# Patient Record
Sex: Male | Born: 1960 | Race: White | Hispanic: No | Smoking: Never smoker
Health system: Southern US, Community
[De-identification: ages and names within clinical notes are randomized; demographics above are authoritative.]

## PROBLEM LIST (undated history)

## (undated) DIAGNOSIS — I499 Cardiac arrhythmia, unspecified: Secondary | ICD-10-CM

## (undated) DIAGNOSIS — R29898 Other symptoms and signs involving the musculoskeletal system: Secondary | ICD-10-CM

## (undated) DIAGNOSIS — K219 Gastro-esophageal reflux disease without esophagitis: Secondary | ICD-10-CM

## (undated) DIAGNOSIS — I10 Essential (primary) hypertension: Secondary | ICD-10-CM

## (undated) DIAGNOSIS — I639 Cerebral infarction, unspecified: Secondary | ICD-10-CM

## (undated) DIAGNOSIS — I509 Heart failure, unspecified: Secondary | ICD-10-CM

## (undated) DIAGNOSIS — I2510 Atherosclerotic heart disease of native coronary artery without angina pectoris: Secondary | ICD-10-CM

## (undated) HISTORY — PX: PACEMAKER INSERTION: SHX728

## (undated) HISTORY — PX: CARDIAC DEFIBRILLATOR PLACEMENT: SHX171

---

## 1998-04-30 ENCOUNTER — Inpatient Hospital Stay (HOSPITAL_COMMUNITY): Admission: EM | Admit: 1998-04-30 | Discharge: 1998-05-11 | Payer: Self-pay | Admitting: Emergency Medicine

## 1998-05-11 ENCOUNTER — Inpatient Hospital Stay (HOSPITAL_COMMUNITY)
Admission: RE | Admit: 1998-05-11 | Discharge: 1998-05-27 | Payer: Self-pay | Admitting: Physical Medicine & Rehabilitation

## 1998-05-27 ENCOUNTER — Inpatient Hospital Stay (HOSPITAL_COMMUNITY): Admission: AD | Admit: 1998-05-27 | Discharge: 1998-06-05 | Payer: Self-pay | Admitting: Geriatric Medicine

## 1998-06-05 ENCOUNTER — Inpatient Hospital Stay (HOSPITAL_COMMUNITY)
Admission: RE | Admit: 1998-06-05 | Discharge: 1998-06-22 | Payer: Self-pay | Admitting: Physical Medicine and Rehabilitation

## 1998-07-09 ENCOUNTER — Encounter
Admission: RE | Admit: 1998-07-09 | Discharge: 1998-10-07 | Payer: Self-pay | Admitting: Physical Medicine and Rehabilitation

## 1998-11-05 ENCOUNTER — Encounter
Admission: RE | Admit: 1998-11-05 | Discharge: 1999-01-11 | Payer: Self-pay | Admitting: Physical Medicine and Rehabilitation

## 2000-08-25 ENCOUNTER — Encounter
Admission: RE | Admit: 2000-08-25 | Discharge: 2000-09-16 | Payer: Self-pay | Admitting: Physical Medicine and Rehabilitation

## 2000-12-25 ENCOUNTER — Encounter
Admission: RE | Admit: 2000-12-25 | Discharge: 2001-03-25 | Payer: Self-pay | Admitting: Physical Medicine and Rehabilitation

## 2002-05-23 ENCOUNTER — Encounter: Payer: Self-pay | Admitting: Emergency Medicine

## 2002-05-23 ENCOUNTER — Emergency Department (HOSPITAL_COMMUNITY): Admission: EM | Admit: 2002-05-23 | Discharge: 2002-05-23 | Payer: Self-pay | Admitting: Emergency Medicine

## 2004-11-06 ENCOUNTER — Ambulatory Visit (HOSPITAL_COMMUNITY): Admission: RE | Admit: 2004-11-06 | Discharge: 2004-11-06 | Payer: Self-pay

## 2004-11-06 ENCOUNTER — Ambulatory Visit (HOSPITAL_BASED_OUTPATIENT_CLINIC_OR_DEPARTMENT_OTHER): Admission: RE | Admit: 2004-11-06 | Discharge: 2004-11-06 | Payer: Self-pay

## 2007-11-03 ENCOUNTER — Ambulatory Visit (HOSPITAL_COMMUNITY): Admission: RE | Admit: 2007-11-03 | Discharge: 2007-11-03 | Payer: Self-pay | Admitting: Internal Medicine

## 2010-07-14 ENCOUNTER — Emergency Department (HOSPITAL_COMMUNITY): Admission: EM | Admit: 2010-07-14 | Discharge: 2010-07-14 | Payer: Self-pay | Admitting: Emergency Medicine

## 2010-12-29 ENCOUNTER — Encounter: Payer: Self-pay | Admitting: Internal Medicine

## 2011-02-21 LAB — DIFFERENTIAL
Basophils Absolute: 0 10*3/uL (ref 0.0–0.1)
Basophils Relative: 1 % (ref 0–1)
Eosinophils Absolute: 0.1 10*3/uL (ref 0.0–0.7)
Eosinophils Relative: 2 % (ref 0–5)
Lymphs Abs: 1.2 10*3/uL (ref 0.7–4.0)
Monocytes Relative: 10 % (ref 3–12)
Neutro Abs: 3.8 10*3/uL (ref 1.7–7.7)
Neutrophils Relative %: 67 % (ref 43–77)

## 2011-02-21 LAB — CBC
MCH: 33.2 pg (ref 26.0–34.0)
MCHC: 33.7 g/dL (ref 30.0–36.0)
RBC: 4.07 MIL/uL — ABNORMAL LOW (ref 4.22–5.81)
RDW: 12.7 % (ref 11.5–15.5)

## 2011-02-21 LAB — BASIC METABOLIC PANEL
BUN: 23 mg/dL (ref 6–23)
CO2: 28 mEq/L (ref 19–32)
Calcium: 9.1 mg/dL (ref 8.4–10.5)
Creatinine, Ser: 1.03 mg/dL (ref 0.4–1.5)
GFR calc non Af Amer: >60 mL/min (ref >60)
Glucose, Bld: 78 mg/dL (ref 70–99)

## 2011-02-21 LAB — PROTIME-INR: INR: 2.52 — ABNORMAL HIGH (ref 0.00–1.49)

## 2011-02-21 LAB — POCT CARDIAC MARKERS: Troponin i, poc: <0.05 ng/mL (ref 0.00–0.09)

## 2011-04-25 NOTE — Op Note (Signed)
NAME:  Jacob Nunez, Jacob Nunez NO.:  [id number]   MEDICAL RECORD NO.:  [id number]          PATIENT TYPE:  AMB   LOCATION:  [facility]                         FACILITY:  [facility]   PHYSICIAN:  Lorre Munroe., M.D.DATE OF BIRTH:  08-Aug-1961   DATE OF PROCEDURE:  11/06/2004  DATE OF DISCHARGE:                                 OPERATIVE REPORT   PREOPERATIVE DIAGNOSES:  Inflamed sebaceous cyst right side of the neck.   POSTOPERATIVE DIAGNOSES:  Inflamed sebaceous cyst right side of the neck.   OPERATION:  Excision of inflamed sebaceous cyst.   SURGEON:  Lebron Conners, M.D.   ANESTHESIA:  Local with sedation.   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  He is on  Coumadin.  The Coumadin was stopped and the patient has been bridged with  Lovenox in preparation for surgery.  He is brought to the operating room for  excision of the recurrently inflamed sebaceous cyst which has been bothering  him.   DESCRIPTION OF PROCEDURE:  After the patient was monitored and sedated and  had routine preparation and draping of the right posterior neck, I liberally  infused local anesthetic around the mass and then the skin over it.  I made  about a 3 cm incision over the mass and dissecting through the exuberant  scar tissue which was present removed the mass in two segments taking a fair  amount of scar tissue and subcutaneous tissue all around it to be sure I got  it all.  I then got hemostasis using bipolar cautery.  I then irrigated the  cavity and removed the irrigant and packed it with a small piece of Surgicel  and closed the skin with intracuticular 4-0 Vicryl suture reinforced by  Steri-Strips.  He tolerated the operation well.     Will   WB/MEDQ  D:  11/06/2004  T:  11/06/2004  Job:  [id number]   cc:   Georgann Housekeeper, MD  [address]  [place]  [place] [address]  Fax: [phone]

## 2011-07-27 ENCOUNTER — Emergency Department (HOSPITAL_COMMUNITY)
Admission: EM | Admit: 2011-07-27 | Discharge: 2011-07-28 | Disposition: A | Discharge status: Home / Self Care | Payer: Medicare Other | Attending: Emergency Medicine | Admitting: Emergency Medicine

## 2011-07-27 ENCOUNTER — Emergency Department (HOSPITAL_COMMUNITY): Payer: Medicare Other

## 2011-07-27 DIAGNOSIS — R002 Palpitations: Secondary | ICD-10-CM | POA: Insufficient documentation

## 2011-07-27 DIAGNOSIS — I498 Other specified cardiac arrhythmias: Secondary | ICD-10-CM | POA: Insufficient documentation

## 2011-07-27 DIAGNOSIS — J189 Pneumonia, unspecified organism: Secondary | ICD-10-CM | POA: Insufficient documentation

## 2011-07-27 DIAGNOSIS — Z79899 Other long term (current) drug therapy: Secondary | ICD-10-CM | POA: Insufficient documentation

## 2011-07-27 DIAGNOSIS — Z950 Presence of cardiac pacemaker: Secondary | ICD-10-CM | POA: Insufficient documentation

## 2011-07-27 DIAGNOSIS — Z7901 Long term (current) use of anticoagulants: Secondary | ICD-10-CM | POA: Insufficient documentation

## 2011-07-27 DIAGNOSIS — Z8673 Personal history of transient ischemic attack (TIA), and cerebral infarction without residual deficits: Secondary | ICD-10-CM | POA: Insufficient documentation

## 2011-07-27 LAB — BASIC METABOLIC PANEL
BUN: 25 mg/dL — ABNORMAL HIGH (ref 6–23)
CO2: 23 mEq/L (ref 19–32)
Glucose, Bld: 112 mg/dL — ABNORMAL HIGH (ref 70–99)
Potassium: 4.1 mEq/L (ref 3.5–5.1)
Sodium: 138 mEq/L (ref 135–145)

## 2011-07-27 LAB — DIFFERENTIAL
Eosinophils Absolute: 0.1 10*3/uL (ref 0.0–0.7)
Eosinophils Relative: 2 % (ref 0–5)
Lymphocytes Relative: 30 % (ref 12–46)
Lymphs Abs: 1.7 10*3/uL (ref 0.7–4.0)
Monocytes Relative: 15 % — ABNORMAL HIGH (ref 3–12)
Neutrophils Relative %: 53 % (ref 43–77)

## 2011-07-27 LAB — CBC
HCT: 43.2 % (ref 39.0–52.0)
MCH: 33.3 pg (ref 26.0–34.0)
MCV: 95.4 fL (ref 78.0–100.0)
Platelets: 144 10*3/uL — ABNORMAL LOW (ref 150–400)
RBC: 4.53 MIL/uL (ref 4.22–5.81)
WBC: 5.8 10*3/uL (ref 4.0–10.5)

## 2011-07-27 LAB — TROPONIN I: Troponin I: <0.3 ng/mL (ref <0.30)

## 2011-07-27 LAB — CK TOTAL AND CKMB (NOT AT ARMC)
CK, MB: 2.8 ng/mL (ref 0.3–4.0)
Relative Index: INVALID (ref 0.0–2.5)

## 2011-10-04 ENCOUNTER — Emergency Department (HOSPITAL_COMMUNITY)
Admission: EM | Admit: 2011-10-04 | Discharge: 2011-10-04 | Disposition: A | Discharge status: Home / Self Care | Payer: Medicare Other | Attending: Emergency Medicine | Admitting: Emergency Medicine

## 2011-10-04 ENCOUNTER — Emergency Department (HOSPITAL_COMMUNITY): Payer: Medicare Other

## 2011-10-04 DIAGNOSIS — I498 Other specified cardiac arrhythmias: Secondary | ICD-10-CM | POA: Insufficient documentation

## 2011-10-04 DIAGNOSIS — I517 Cardiomegaly: Secondary | ICD-10-CM | POA: Insufficient documentation

## 2011-10-04 DIAGNOSIS — Z95810 Presence of automatic (implantable) cardiac defibrillator: Secondary | ICD-10-CM | POA: Insufficient documentation

## 2011-10-04 DIAGNOSIS — I4510 Unspecified right bundle-branch block: Secondary | ICD-10-CM | POA: Insufficient documentation

## 2011-10-04 DIAGNOSIS — Q249 Congenital malformation of heart, unspecified: Secondary | ICD-10-CM | POA: Insufficient documentation

## 2011-10-04 LAB — BASIC METABOLIC PANEL
Chloride: 105 mEq/L (ref 96–112)
Creatinine, Ser: 0.9 mg/dL (ref 0.50–1.35)
GFR calc Af Amer: >90 mL/min (ref >90)
Sodium: 137 mEq/L (ref 135–145)

## 2011-10-04 LAB — DIFFERENTIAL
Basophils Absolute: 0 10*3/uL (ref 0.0–0.1)
Basophils Relative: 0 % (ref 0–1)
Lymphocytes Relative: 22 % (ref 12–46)
Monocytes Absolute: 0.7 10*3/uL (ref 0.1–1.0)
Neutro Abs: 3.1 10*3/uL (ref 1.7–7.7)
Neutrophils Relative %: 64 % (ref 43–77)

## 2011-10-04 LAB — CBC
HCT: 44.4 % (ref 39.0–52.0)
Hemoglobin: 15.2 g/dL (ref 13.0–17.0)
MCHC: 34.2 g/dL (ref 30.0–36.0)
RBC: 4.63 MIL/uL (ref 4.22–5.81)
WBC: 4.8 10*3/uL (ref 4.0–10.5)

## 2011-10-04 LAB — POCT I-STAT TROPONIN I: Troponin i, poc: 0.01 ng/mL (ref 0.00–0.08)

## 2011-11-16 ENCOUNTER — Encounter: Payer: Self-pay | Admitting: *Deleted

## 2011-11-16 ENCOUNTER — Emergency Department (HOSPITAL_COMMUNITY): Payer: Medicare Other

## 2011-11-16 ENCOUNTER — Other Ambulatory Visit: Payer: Self-pay

## 2011-11-16 ENCOUNTER — Emergency Department (HOSPITAL_COMMUNITY)
Admission: EM | Admit: 2011-11-16 | Discharge: 2011-11-17 | Payer: Medicare Other | Attending: Emergency Medicine | Admitting: Emergency Medicine

## 2011-11-16 DIAGNOSIS — R0600 Dyspnea, unspecified: Secondary | ICD-10-CM

## 2011-11-16 DIAGNOSIS — R0902 Hypoxemia: Secondary | ICD-10-CM

## 2011-11-16 DIAGNOSIS — R0609 Other forms of dyspnea: Secondary | ICD-10-CM | POA: Insufficient documentation

## 2011-11-16 DIAGNOSIS — K219 Gastro-esophageal reflux disease without esophagitis: Secondary | ICD-10-CM | POA: Insufficient documentation

## 2011-11-16 DIAGNOSIS — J189 Pneumonia, unspecified organism: Secondary | ICD-10-CM | POA: Insufficient documentation

## 2011-11-16 DIAGNOSIS — Z8673 Personal history of transient ischemic attack (TIA), and cerebral infarction without residual deficits: Secondary | ICD-10-CM | POA: Insufficient documentation

## 2011-11-16 DIAGNOSIS — R059 Cough, unspecified: Secondary | ICD-10-CM | POA: Insufficient documentation

## 2011-11-16 DIAGNOSIS — R0989 Other specified symptoms and signs involving the circulatory and respiratory systems: Secondary | ICD-10-CM | POA: Insufficient documentation

## 2011-11-16 DIAGNOSIS — I499 Cardiac arrhythmia, unspecified: Secondary | ICD-10-CM | POA: Insufficient documentation

## 2011-11-16 DIAGNOSIS — Q203 Discordant ventriculoarterial connection: Secondary | ICD-10-CM | POA: Insufficient documentation

## 2011-11-16 DIAGNOSIS — R05 Cough: Secondary | ICD-10-CM | POA: Insufficient documentation

## 2011-11-16 DIAGNOSIS — R29898 Other symptoms and signs involving the musculoskeletal system: Secondary | ICD-10-CM | POA: Insufficient documentation

## 2011-11-16 DIAGNOSIS — Z950 Presence of cardiac pacemaker: Secondary | ICD-10-CM | POA: Insufficient documentation

## 2011-11-16 DIAGNOSIS — R0602 Shortness of breath: Secondary | ICD-10-CM | POA: Insufficient documentation

## 2011-11-16 DIAGNOSIS — R509 Fever, unspecified: Secondary | ICD-10-CM | POA: Insufficient documentation

## 2011-11-16 HISTORY — DX: Cardiac arrhythmia, unspecified: I49.9

## 2011-11-16 HISTORY — DX: Other symptoms and signs involving the musculoskeletal system: R29.898

## 2011-11-16 HISTORY — DX: Essential (primary) hypertension: I10

## 2011-11-16 HISTORY — DX: Atherosclerotic heart disease of native coronary artery without angina pectoris: I25.10

## 2011-11-16 HISTORY — DX: Gastro-esophageal reflux disease without esophagitis: K21.9

## 2011-11-16 HISTORY — DX: Heart failure, unspecified: I50.9

## 2011-11-16 HISTORY — DX: Cerebral infarction, unspecified: I63.9

## 2011-11-16 LAB — CBC
HCT: 39.9 % (ref 39.0–52.0)
Platelets: 117 10*3/uL — ABNORMAL LOW (ref 150–400)
RBC: 4.06 MIL/uL — ABNORMAL LOW (ref 4.22–5.81)
RDW: 14.5 % (ref 11.5–15.5)
WBC: 10 10*3/uL (ref 4.0–10.5)

## 2011-11-16 LAB — URINALYSIS, ROUTINE W REFLEX MICROSCOPIC
Bilirubin Urine: NEGATIVE
Leukocytes, UA: NEGATIVE
Nitrite: NEGATIVE
Specific Gravity, Urine: 1.013 (ref 1.005–1.030)
Urobilinogen, UA: 0.2 mg/dL (ref 0.0–1.0)

## 2011-11-16 LAB — COMPREHENSIVE METABOLIC PANEL
ALT: 10 U/L (ref 0–53)
AST: 19 U/L (ref 0–37)
Alkaline Phosphatase: 70 U/L (ref 39–117)
Calcium: 9.1 mg/dL (ref 8.4–10.5)
Potassium: 4.5 mEq/L (ref 3.5–5.1)
Sodium: 140 mEq/L (ref 135–145)
Total Protein: 7.6 g/dL (ref 6.0–8.3)

## 2011-11-16 LAB — DIFFERENTIAL
Basophils Absolute: 0 10*3/uL (ref 0.0–0.1)
Eosinophils Absolute: 0.1 10*3/uL (ref 0.0–0.7)
Eosinophils Relative: 1 % (ref 0–5)
Lymphocytes Relative: 10 % — ABNORMAL LOW (ref 12–46)
Neutrophils Relative %: 81 % — ABNORMAL HIGH (ref 43–77)

## 2011-11-16 LAB — CK TOTAL AND CKMB (NOT AT ARMC)
CK, MB: 2.3 ng/mL (ref 0.3–4.0)
Relative Index: INVALID (ref 0.0–2.5)

## 2011-11-16 LAB — TROPONIN I: Troponin I: <0.3 ng/mL (ref <0.30)

## 2011-11-16 MED ORDER — ALBUTEROL SULFATE (5 MG/ML) 0.5% IN NEBU
INHALATION_SOLUTION | RESPIRATORY_TRACT | Status: AC
  Filled 2011-11-16: qty 1

## 2011-11-16 MED ORDER — PIPERACILLIN-TAZOBACTAM 3.375 G IVPB
3.3750 g | Freq: Once | INTRAVENOUS | Status: AC
  Administered 2011-11-16: 3.375 g via INTRAVENOUS
  Filled 2011-11-16: qty 50

## 2011-11-16 MED ORDER — ACETAMINOPHEN 325 MG PO TABS
ORAL_TABLET | ORAL | Status: AC
  Filled 2011-11-16: qty 2

## 2011-11-16 MED ORDER — ACETAMINOPHEN 325 MG PO TABS
ORAL_TABLET | ORAL | Status: AC
  Filled 2011-11-16: qty 1

## 2011-11-16 MED ORDER — ACETAMINOPHEN 500 MG PO TABS
1000.0000 mg | ORAL_TABLET | Freq: Once | ORAL | Status: AC
  Administered 2011-11-16: 975 mg via ORAL
  Filled 2011-11-16: qty 2

## 2011-11-16 MED ORDER — SODIUM CHLORIDE 0.9 % IV SOLN
Freq: Once | INTRAVENOUS | Status: AC
  Administered 2011-11-16: 16:00:00 via INTRAVENOUS

## 2011-11-16 NOTE — ED Notes (Signed)
Awaiting bed assignment at [facility]. Ate half of diet tray

## 2011-11-16 NOTE — ED Notes (Signed)
Gave old and new ECG to Dr. Judd Lien after I performed.12:27 pm JG

## 2011-11-16 NOTE — ED Notes (Signed)
S/P pacemaker surgery (wires changed) last Thursday. C/o non prod cough since yesterday. Today vomit x1 with coughing spell, chills, worsening SOB. Denies any pain. Denies SOB presently, states neb given by EMS helped a lot. Continues to be tachypneic.

## 2011-11-16 NOTE — ED Provider Notes (Signed)
History     CSN: [id number] Arrival date & time: 11/16/2011 12:06 PM   First MD Initiated Contact with Patient 11/16/11 1210      Chief Complaint  Patient presents with  . Shortness of Breath  . 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  Most recently he had a procedure to remove/replace pacemaker/defib leads at [facility].  Started today with cough, chest congestion and feeling short of breath.  Per mother, patient gets pneumonia easily.  Patient is a 50 y.o. male presenting with shortness of breath and cough. The history is provided by the patient and a relative.  Shortness of Breath  The current episode started today. The problem occurs occasionally. The problem has been gradually worsening. The problem is moderate. The symptoms are relieved by nothing. Associated symptoms include cough and shortness of breath. Pertinent negatives include no chest pain, no chest pressure and no fever.  Cough Associated symptoms include shortness of breath. Pertinent negatives include no chest pain.    Past Medical History  Diagnosis Date  . Stroke   . Arrhythmia     Past Surgical History  Procedure Date  . Pacemaker insertion     No family history on file.  History  Substance Use Topics  . Smoking status: Not on file  . Smokeless tobacco: Not on file  . Alcohol Use:       Review of Systems  Constitutional: Negative for fever.  Respiratory: Positive for cough and shortness of breath.   Cardiovascular: Negative for chest pain.  All other systems reviewed and are negative.    Allergies  Zithromax  Home Medications   Current Outpatient Rx  Name Route Sig Dispense Refill  . ALBUTEROL SULFATE (2.5 MG/3ML) 0.083% IN NEBU Nebulization Take 5 mg by nebulization.        BP 119/64  Pulse 79  Temp(Src)  99.5 F (37.5 C) (Oral)  Resp 13  SpO2 93%  Physical Exam  Constitutional: He is oriented to person, place, and time. He appears well-developed and well-nourished.  HENT:  Head: Normocephalic and atraumatic.  Mouth/Throat: Oropharynx is clear and moist.  Neck: Normal range of motion. Neck supple.  Cardiovascular: Normal rate and regular rhythm.   Pulmonary/Chest: Effort normal. He has rales.       In bases bilat.  Abdominal: Soft. Bowel sounds are normal. He exhibits no distension. There is no tenderness.  Musculoskeletal: Normal range of motion. He exhibits no edema.  Neurological: He is alert and oriented to person, place, and time.  Skin: Skin is warm and dry.    ED Course  Procedures (including critical care time)  Labs Reviewed - No data to display No results found.   No diagnosis found.    MDM  Patient has a complex medical history and the vast majority of his care has been at [facility].  Today he is hypoxic, coughing and gagging.  He has no wbc count or fever but mother is concerned he develops pneumonia easily.  I spoke with Dr. Sherlene Shams who is on call at [facility] for Pediatric cardiology to discuss the proper management of this patient.   Dr. Sherlene Shams called back and informed me he is in the process of arranging a bed and that we should hear soon.  I updated him on the patient's new fever.  We discussed blood cultures which will be obtained,  and antibiotics will be given as long as this does not hold up the transfer.  Awaiting bed assignment.          Geoffery Lyons, MD 11/16/11 1556

## 2011-11-16 NOTE — ED Notes (Signed)
Informed by [facility] that pt would not be picked up for transport until tomorrow morning d/t [facility] only having 2 trucks in service. [facility] called for transport. Informed they would arrive in 3-4 hours. Family notified.

## 2011-11-16 NOTE — ED Notes (Signed)
C/o SOB, cough x 2 days. Per EMS - POX 84% RA upon arrival, neb with albuterol 5mg , SL LH

## 2011-11-16 NOTE — ED Notes (Signed)
Informed patient and/or family of status. Denies any pain or SOB. Continues to be tachypneic, RR 28/min. NAD.

## 2011-11-16 NOTE — ED Notes (Signed)
Continues to rest quietly in bed. Resp even and unlabored. Denies feeling sob. On 3L o2 per n/c. Denies pain. Family at bedside. Awaiting transfer to [facility].

## 2011-11-16 NOTE — ED Notes (Signed)
[place] EDP at bedside. 

## 2011-11-16 NOTE — ED Notes (Signed)
No change in pt condition. Continues to rest quietly in bed. No sob or pain noted. Awaiting transfer

## 2011-11-16 NOTE — ED Notes (Signed)
Informed patient and/or family of status. Pt to be transferred to [facility]. Awaiting call from [facility] with an inpatient bed. No voiced complaints presently. Diet tray ordered

## 2011-11-17 NOTE — ED Notes (Signed)
Pt transferred to [facility] by [facility] transport. Mother is power of attorney and signed EMTALA transfer document.

## 2011-11-18 LAB — URINE CULTURE

## 2011-11-22 LAB — CULTURE, BLOOD (ROUTINE X 2): Culture: NO GROWTH

## 2011-12-12 DIAGNOSIS — Q203 Discordant ventriculoarterial connection: Secondary | ICD-10-CM | POA: Diagnosis not present

## 2011-12-31 DIAGNOSIS — Z7901 Long term (current) use of anticoagulants: Secondary | ICD-10-CM | POA: Diagnosis not present

## 2012-01-09 DIAGNOSIS — Z09 Encounter for follow-up examination after completed treatment for conditions other than malignant neoplasm: Secondary | ICD-10-CM | POA: Diagnosis not present

## 2012-01-09 DIAGNOSIS — J90 Pleural effusion, not elsewhere classified: Secondary | ICD-10-CM | POA: Diagnosis not present

## 2012-01-09 DIAGNOSIS — R918 Other nonspecific abnormal finding of lung field: Secondary | ICD-10-CM | POA: Diagnosis not present

## 2012-01-09 DIAGNOSIS — I5022 Chronic systolic (congestive) heart failure: Secondary | ICD-10-CM | POA: Diagnosis not present

## 2012-01-28 DIAGNOSIS — Z7901 Long term (current) use of anticoagulants: Secondary | ICD-10-CM | POA: Diagnosis not present

## 2012-02-13 DIAGNOSIS — I509 Heart failure, unspecified: Secondary | ICD-10-CM | POA: Diagnosis not present

## 2012-02-13 DIAGNOSIS — Z0000 Encounter for general adult medical examination without abnormal findings: Secondary | ICD-10-CM | POA: Diagnosis not present

## 2012-02-13 DIAGNOSIS — K219 Gastro-esophageal reflux disease without esophagitis: Secondary | ICD-10-CM | POA: Diagnosis not present

## 2012-02-13 DIAGNOSIS — I6990 Unspecified sequelae of unspecified cerebrovascular disease: Secondary | ICD-10-CM | POA: Diagnosis not present

## 2012-02-13 DIAGNOSIS — I10 Essential (primary) hypertension: Secondary | ICD-10-CM | POA: Diagnosis not present

## 2012-02-23 DIAGNOSIS — J069 Acute upper respiratory infection, unspecified: Secondary | ICD-10-CM | POA: Diagnosis not present

## 2012-02-25 DIAGNOSIS — Z7901 Long term (current) use of anticoagulants: Secondary | ICD-10-CM | POA: Diagnosis not present

## 2012-03-09 DIAGNOSIS — Z8774 Personal history of (corrected) congenital malformations of heart and circulatory system: Secondary | ICD-10-CM | POA: Diagnosis not present

## 2012-03-09 DIAGNOSIS — Z9889 Other specified postprocedural states: Secondary | ICD-10-CM | POA: Diagnosis not present

## 2012-03-09 DIAGNOSIS — Q203 Discordant ventriculoarterial connection: Secondary | ICD-10-CM | POA: Diagnosis not present

## 2012-03-09 DIAGNOSIS — Z7901 Long term (current) use of anticoagulants: Secondary | ICD-10-CM | POA: Diagnosis not present

## 2012-03-09 DIAGNOSIS — I495 Sick sinus syndrome: Secondary | ICD-10-CM | POA: Diagnosis not present

## 2012-03-09 DIAGNOSIS — I2789 Other specified pulmonary heart diseases: Secondary | ICD-10-CM | POA: Diagnosis not present

## 2012-03-09 DIAGNOSIS — Z79899 Other long term (current) drug therapy: Secondary | ICD-10-CM | POA: Diagnosis not present

## 2012-03-15 DIAGNOSIS — Z7901 Long term (current) use of anticoagulants: Secondary | ICD-10-CM | POA: Diagnosis not present

## 2012-03-22 DIAGNOSIS — Z7901 Long term (current) use of anticoagulants: Secondary | ICD-10-CM | POA: Diagnosis not present

## 2012-04-01 DIAGNOSIS — I519 Heart disease, unspecified: Secondary | ICD-10-CM | POA: Diagnosis not present

## 2012-04-21 DIAGNOSIS — Z7901 Long term (current) use of anticoagulants: Secondary | ICD-10-CM | POA: Diagnosis not present

## 2012-05-19 DIAGNOSIS — Z7901 Long term (current) use of anticoagulants: Secondary | ICD-10-CM | POA: Diagnosis not present

## 2012-05-26 DIAGNOSIS — R05 Cough: Secondary | ICD-10-CM | POA: Diagnosis not present

## 2012-06-16 DIAGNOSIS — Z7901 Long term (current) use of anticoagulants: Secondary | ICD-10-CM | POA: Diagnosis not present

## 2012-06-16 DIAGNOSIS — R05 Cough: Secondary | ICD-10-CM | POA: Diagnosis not present

## 2012-06-18 DIAGNOSIS — Q203 Discordant ventriculoarterial connection: Secondary | ICD-10-CM | POA: Diagnosis not present

## 2012-07-14 DIAGNOSIS — Z7901 Long term (current) use of anticoagulants: Secondary | ICD-10-CM | POA: Diagnosis not present

## 2012-07-30 DIAGNOSIS — Q203 Discordant ventriculoarterial connection: Secondary | ICD-10-CM | POA: Diagnosis not present

## 2012-07-30 DIAGNOSIS — I2789 Other specified pulmonary heart diseases: Secondary | ICD-10-CM | POA: Diagnosis not present

## 2012-07-30 DIAGNOSIS — I6350 Cerebral infarction due to unspecified occlusion or stenosis of unspecified cerebral artery: Secondary | ICD-10-CM | POA: Diagnosis not present

## 2012-07-30 DIAGNOSIS — Z95810 Presence of automatic (implantable) cardiac defibrillator: Secondary | ICD-10-CM | POA: Diagnosis not present

## 2012-08-11 DIAGNOSIS — Z7901 Long term (current) use of anticoagulants: Secondary | ICD-10-CM | POA: Diagnosis not present

## 2012-08-17 DIAGNOSIS — K219 Gastro-esophageal reflux disease without esophagitis: Secondary | ICD-10-CM | POA: Diagnosis not present

## 2012-08-17 DIAGNOSIS — I6990 Unspecified sequelae of unspecified cerebrovascular disease: Secondary | ICD-10-CM | POA: Diagnosis not present

## 2012-08-17 DIAGNOSIS — Z0000 Encounter for general adult medical examination without abnormal findings: Secondary | ICD-10-CM | POA: Diagnosis not present

## 2012-08-17 DIAGNOSIS — Z125 Encounter for screening for malignant neoplasm of prostate: Secondary | ICD-10-CM | POA: Diagnosis not present

## 2012-08-17 DIAGNOSIS — I509 Heart failure, unspecified: Secondary | ICD-10-CM | POA: Diagnosis not present

## 2012-08-17 DIAGNOSIS — I10 Essential (primary) hypertension: Secondary | ICD-10-CM | POA: Diagnosis not present

## 2012-08-17 DIAGNOSIS — Z1331 Encounter for screening for depression: Secondary | ICD-10-CM | POA: Diagnosis not present

## 2012-08-17 DIAGNOSIS — I4891 Unspecified atrial fibrillation: Secondary | ICD-10-CM | POA: Diagnosis not present

## 2012-08-17 DIAGNOSIS — Z23 Encounter for immunization: Secondary | ICD-10-CM | POA: Diagnosis not present

## 2012-08-19 DIAGNOSIS — Z1211 Encounter for screening for malignant neoplasm of colon: Secondary | ICD-10-CM | POA: Diagnosis not present

## 2012-09-01 DIAGNOSIS — Z79899 Other long term (current) drug therapy: Secondary | ICD-10-CM | POA: Diagnosis not present

## 2012-09-07 DIAGNOSIS — Z7901 Long term (current) use of anticoagulants: Secondary | ICD-10-CM | POA: Diagnosis not present

## 2012-09-07 DIAGNOSIS — I509 Heart failure, unspecified: Secondary | ICD-10-CM | POA: Diagnosis not present

## 2012-09-14 DIAGNOSIS — Z23 Encounter for immunization: Secondary | ICD-10-CM | POA: Diagnosis not present

## 2012-09-22 DIAGNOSIS — I509 Heart failure, unspecified: Secondary | ICD-10-CM | POA: Diagnosis not present

## 2012-09-22 DIAGNOSIS — Z7901 Long term (current) use of anticoagulants: Secondary | ICD-10-CM | POA: Diagnosis not present

## 2012-10-06 DIAGNOSIS — Z7901 Long term (current) use of anticoagulants: Secondary | ICD-10-CM | POA: Diagnosis not present

## 2012-10-20 DIAGNOSIS — Z7901 Long term (current) use of anticoagulants: Secondary | ICD-10-CM | POA: Diagnosis not present

## 2012-11-17 DIAGNOSIS — Z7901 Long term (current) use of anticoagulants: Secondary | ICD-10-CM | POA: Diagnosis not present

## 2012-11-29 DIAGNOSIS — R350 Frequency of micturition: Secondary | ICD-10-CM | POA: Diagnosis not present

## 2012-12-15 DIAGNOSIS — R350 Frequency of micturition: Secondary | ICD-10-CM | POA: Diagnosis not present

## 2012-12-15 DIAGNOSIS — Z7901 Long term (current) use of anticoagulants: Secondary | ICD-10-CM | POA: Diagnosis not present

## 2012-12-17 DIAGNOSIS — I509 Heart failure, unspecified: Secondary | ICD-10-CM | POA: Diagnosis not present

## 2012-12-17 DIAGNOSIS — Q203 Discordant ventriculoarterial connection: Secondary | ICD-10-CM | POA: Diagnosis not present

## 2012-12-17 DIAGNOSIS — R0602 Shortness of breath: Secondary | ICD-10-CM | POA: Diagnosis not present

## 2013-01-12 DIAGNOSIS — Z7901 Long term (current) use of anticoagulants: Secondary | ICD-10-CM | POA: Diagnosis not present

## 2013-02-04 DIAGNOSIS — I2789 Other specified pulmonary heart diseases: Secondary | ICD-10-CM | POA: Diagnosis not present

## 2013-02-04 DIAGNOSIS — I079 Rheumatic tricuspid valve disease, unspecified: Secondary | ICD-10-CM | POA: Diagnosis not present

## 2013-02-04 DIAGNOSIS — I288 Other diseases of pulmonary vessels: Secondary | ICD-10-CM | POA: Diagnosis not present

## 2013-02-04 DIAGNOSIS — I517 Cardiomegaly: Secondary | ICD-10-CM | POA: Diagnosis not present

## 2013-02-04 DIAGNOSIS — I495 Sick sinus syndrome: Secondary | ICD-10-CM | POA: Diagnosis not present

## 2013-02-04 DIAGNOSIS — I080 Rheumatic disorders of both mitral and aortic valves: Secondary | ICD-10-CM | POA: Diagnosis not present

## 2013-02-04 DIAGNOSIS — I4892 Unspecified atrial flutter: Secondary | ICD-10-CM | POA: Diagnosis not present

## 2013-02-04 DIAGNOSIS — Q203 Discordant ventriculoarterial connection: Secondary | ICD-10-CM | POA: Diagnosis not present

## 2013-02-04 DIAGNOSIS — R091 Pleurisy: Secondary | ICD-10-CM | POA: Diagnosis not present

## 2013-02-15 DIAGNOSIS — I4891 Unspecified atrial fibrillation: Secondary | ICD-10-CM | POA: Diagnosis not present

## 2013-02-17 DIAGNOSIS — I2789 Other specified pulmonary heart diseases: Secondary | ICD-10-CM | POA: Diagnosis not present

## 2013-02-17 DIAGNOSIS — Z95810 Presence of automatic (implantable) cardiac defibrillator: Secondary | ICD-10-CM | POA: Diagnosis not present

## 2013-02-17 DIAGNOSIS — F79 Unspecified intellectual disabilities: Secondary | ICD-10-CM | POA: Diagnosis not present

## 2013-02-17 DIAGNOSIS — Z09 Encounter for follow-up examination after completed treatment for conditions other than malignant neoplasm: Secondary | ICD-10-CM | POA: Diagnosis not present

## 2013-02-17 DIAGNOSIS — Z9889 Other specified postprocedural states: Secondary | ICD-10-CM | POA: Diagnosis not present

## 2013-02-17 DIAGNOSIS — I69998 Other sequelae following unspecified cerebrovascular disease: Secondary | ICD-10-CM | POA: Diagnosis not present

## 2013-03-23 DIAGNOSIS — Z7901 Long term (current) use of anticoagulants: Secondary | ICD-10-CM | POA: Diagnosis not present

## 2013-04-13 DIAGNOSIS — Z7901 Long term (current) use of anticoagulants: Secondary | ICD-10-CM | POA: Diagnosis not present

## 2013-05-11 DIAGNOSIS — Z7901 Long term (current) use of anticoagulants: Secondary | ICD-10-CM | POA: Diagnosis not present

## 2013-06-08 DIAGNOSIS — Z7901 Long term (current) use of anticoagulants: Secondary | ICD-10-CM | POA: Diagnosis not present

## 2013-06-17 DIAGNOSIS — I2789 Other specified pulmonary heart diseases: Secondary | ICD-10-CM | POA: Diagnosis not present

## 2013-06-17 DIAGNOSIS — Q203 Discordant ventriculoarterial connection: Secondary | ICD-10-CM | POA: Diagnosis not present

## 2013-07-06 DIAGNOSIS — Z7901 Long term (current) use of anticoagulants: Secondary | ICD-10-CM | POA: Diagnosis not present

## 2013-07-27 DIAGNOSIS — Z7901 Long term (current) use of anticoagulants: Secondary | ICD-10-CM | POA: Diagnosis not present

## 2013-08-01 DIAGNOSIS — Z95810 Presence of automatic (implantable) cardiac defibrillator: Secondary | ICD-10-CM | POA: Diagnosis not present

## 2013-08-01 DIAGNOSIS — Z9889 Other specified postprocedural states: Secondary | ICD-10-CM | POA: Diagnosis not present

## 2013-08-01 DIAGNOSIS — Q203 Discordant ventriculoarterial connection: Secondary | ICD-10-CM | POA: Diagnosis not present

## 2013-08-01 DIAGNOSIS — I495 Sick sinus syndrome: Secondary | ICD-10-CM | POA: Diagnosis not present

## 2013-08-01 DIAGNOSIS — Z4502 Encounter for adjustment and management of automatic implantable cardiac defibrillator: Secondary | ICD-10-CM | POA: Diagnosis not present

## 2013-08-01 DIAGNOSIS — I2789 Other specified pulmonary heart diseases: Secondary | ICD-10-CM | POA: Diagnosis not present

## 2013-08-01 DIAGNOSIS — I4892 Unspecified atrial flutter: Secondary | ICD-10-CM | POA: Diagnosis not present

## 2013-08-19 DIAGNOSIS — Z7901 Long term (current) use of anticoagulants: Secondary | ICD-10-CM | POA: Diagnosis not present

## 2013-08-19 DIAGNOSIS — I4891 Unspecified atrial fibrillation: Secondary | ICD-10-CM | POA: Diagnosis not present

## 2013-08-19 DIAGNOSIS — K219 Gastro-esophageal reflux disease without esophagitis: Secondary | ICD-10-CM | POA: Diagnosis not present

## 2013-08-19 DIAGNOSIS — Z0000 Encounter for general adult medical examination without abnormal findings: Secondary | ICD-10-CM | POA: Diagnosis not present

## 2013-08-19 DIAGNOSIS — I6990 Unspecified sequelae of unspecified cerebrovascular disease: Secondary | ICD-10-CM | POA: Diagnosis not present

## 2013-08-19 DIAGNOSIS — I10 Essential (primary) hypertension: Secondary | ICD-10-CM | POA: Diagnosis not present

## 2013-08-19 DIAGNOSIS — I509 Heart failure, unspecified: Secondary | ICD-10-CM | POA: Diagnosis not present

## 2013-09-21 DIAGNOSIS — Z7901 Long term (current) use of anticoagulants: Secondary | ICD-10-CM | POA: Diagnosis not present

## 2013-10-08 DIAGNOSIS — Z23 Encounter for immunization: Secondary | ICD-10-CM | POA: Diagnosis not present

## 2013-10-19 DIAGNOSIS — Z7901 Long term (current) use of anticoagulants: Secondary | ICD-10-CM | POA: Diagnosis not present

## 2013-10-19 DIAGNOSIS — I4891 Unspecified atrial fibrillation: Secondary | ICD-10-CM | POA: Diagnosis not present

## 2013-11-14 DIAGNOSIS — Z4502 Encounter for adjustment and management of automatic implantable cardiac defibrillator: Secondary | ICD-10-CM | POA: Diagnosis not present

## 2013-11-16 DIAGNOSIS — Z7901 Long term (current) use of anticoagulants: Secondary | ICD-10-CM | POA: Diagnosis not present

## 2013-11-16 DIAGNOSIS — I4891 Unspecified atrial fibrillation: Secondary | ICD-10-CM | POA: Diagnosis not present

## 2013-12-14 DIAGNOSIS — Z8679 Personal history of other diseases of the circulatory system: Secondary | ICD-10-CM | POA: Diagnosis not present

## 2013-12-14 DIAGNOSIS — Z7901 Long term (current) use of anticoagulants: Secondary | ICD-10-CM | POA: Diagnosis not present

## 2013-12-16 DIAGNOSIS — Q203 Discordant ventriculoarterial connection: Secondary | ICD-10-CM | POA: Diagnosis not present

## 2013-12-16 DIAGNOSIS — I4892 Unspecified atrial flutter: Secondary | ICD-10-CM | POA: Diagnosis not present

## 2014-01-04 DIAGNOSIS — Z7901 Long term (current) use of anticoagulants: Secondary | ICD-10-CM | POA: Diagnosis not present

## 2014-01-04 DIAGNOSIS — Z79899 Other long term (current) drug therapy: Secondary | ICD-10-CM | POA: Diagnosis not present

## 2014-01-04 DIAGNOSIS — Z8679 Personal history of other diseases of the circulatory system: Secondary | ICD-10-CM | POA: Diagnosis not present

## 2014-02-01 DIAGNOSIS — Z7901 Long term (current) use of anticoagulants: Secondary | ICD-10-CM | POA: Diagnosis not present

## 2014-02-01 DIAGNOSIS — I4891 Unspecified atrial fibrillation: Secondary | ICD-10-CM | POA: Diagnosis not present

## 2014-02-11 DIAGNOSIS — I519 Heart disease, unspecified: Secondary | ICD-10-CM | POA: Diagnosis not present

## 2014-02-17 DIAGNOSIS — K219 Gastro-esophageal reflux disease without esophagitis: Secondary | ICD-10-CM | POA: Diagnosis not present

## 2014-02-17 DIAGNOSIS — N183 Chronic kidney disease, stage 3 unspecified: Secondary | ICD-10-CM | POA: Diagnosis not present

## 2014-02-17 DIAGNOSIS — I509 Heart failure, unspecified: Secondary | ICD-10-CM | POA: Diagnosis not present

## 2014-02-17 DIAGNOSIS — I10 Essential (primary) hypertension: Secondary | ICD-10-CM | POA: Diagnosis not present

## 2014-02-17 DIAGNOSIS — I4891 Unspecified atrial fibrillation: Secondary | ICD-10-CM | POA: Diagnosis not present

## 2014-02-27 DIAGNOSIS — I2789 Other specified pulmonary heart diseases: Secondary | ICD-10-CM | POA: Diagnosis not present

## 2014-02-27 DIAGNOSIS — Z5181 Encounter for therapeutic drug level monitoring: Secondary | ICD-10-CM | POA: Diagnosis not present

## 2014-02-27 DIAGNOSIS — I428 Other cardiomyopathies: Secondary | ICD-10-CM | POA: Diagnosis not present

## 2014-02-27 DIAGNOSIS — Z79899 Other long term (current) drug therapy: Secondary | ICD-10-CM | POA: Diagnosis not present

## 2014-02-27 DIAGNOSIS — I6350 Cerebral infarction due to unspecified occlusion or stenosis of unspecified cerebral artery: Secondary | ICD-10-CM | POA: Diagnosis not present

## 2014-02-27 DIAGNOSIS — Q203 Discordant ventriculoarterial connection: Secondary | ICD-10-CM | POA: Diagnosis not present

## 2014-03-01 DIAGNOSIS — Z7901 Long term (current) use of anticoagulants: Secondary | ICD-10-CM | POA: Diagnosis not present

## 2014-03-01 DIAGNOSIS — I4891 Unspecified atrial fibrillation: Secondary | ICD-10-CM | POA: Diagnosis not present

## 2014-03-04 DIAGNOSIS — I4891 Unspecified atrial fibrillation: Secondary | ICD-10-CM | POA: Diagnosis not present

## 2014-03-04 DIAGNOSIS — Z7901 Long term (current) use of anticoagulants: Secondary | ICD-10-CM | POA: Diagnosis not present

## 2014-03-08 DIAGNOSIS — I4891 Unspecified atrial fibrillation: Secondary | ICD-10-CM | POA: Diagnosis not present

## 2014-03-08 DIAGNOSIS — Z7901 Long term (current) use of anticoagulants: Secondary | ICD-10-CM | POA: Diagnosis not present

## 2014-03-29 DIAGNOSIS — I4891 Unspecified atrial fibrillation: Secondary | ICD-10-CM | POA: Diagnosis not present

## 2014-03-29 DIAGNOSIS — Z7901 Long term (current) use of anticoagulants: Secondary | ICD-10-CM | POA: Diagnosis not present

## 2014-04-26 DIAGNOSIS — I4891 Unspecified atrial fibrillation: Secondary | ICD-10-CM | POA: Diagnosis not present

## 2014-04-26 DIAGNOSIS — Z7901 Long term (current) use of anticoagulants: Secondary | ICD-10-CM | POA: Diagnosis not present

## 2014-05-15 DIAGNOSIS — I519 Heart disease, unspecified: Secondary | ICD-10-CM | POA: Diagnosis not present

## 2014-05-24 DIAGNOSIS — Z7901 Long term (current) use of anticoagulants: Secondary | ICD-10-CM | POA: Diagnosis not present

## 2014-05-24 DIAGNOSIS — I4891 Unspecified atrial fibrillation: Secondary | ICD-10-CM | POA: Diagnosis not present

## 2014-06-21 DIAGNOSIS — Z7901 Long term (current) use of anticoagulants: Secondary | ICD-10-CM | POA: Diagnosis not present

## 2014-06-21 DIAGNOSIS — I4891 Unspecified atrial fibrillation: Secondary | ICD-10-CM | POA: Diagnosis not present

## 2014-06-30 DIAGNOSIS — Q203 Discordant ventriculoarterial connection: Secondary | ICD-10-CM | POA: Diagnosis not present

## 2014-06-30 DIAGNOSIS — I2789 Other specified pulmonary heart diseases: Secondary | ICD-10-CM | POA: Diagnosis not present

## 2014-06-30 DIAGNOSIS — Z9889 Other specified postprocedural states: Secondary | ICD-10-CM | POA: Diagnosis not present

## 2014-07-19 DIAGNOSIS — Z7901 Long term (current) use of anticoagulants: Secondary | ICD-10-CM | POA: Diagnosis not present

## 2014-07-19 DIAGNOSIS — Z5181 Encounter for therapeutic drug level monitoring: Secondary | ICD-10-CM | POA: Diagnosis not present

## 2014-08-15 DIAGNOSIS — Q203 Discordant ventriculoarterial connection: Secondary | ICD-10-CM | POA: Diagnosis not present

## 2014-08-15 DIAGNOSIS — Z95810 Presence of automatic (implantable) cardiac defibrillator: Secondary | ICD-10-CM | POA: Diagnosis not present

## 2014-08-15 DIAGNOSIS — I519 Heart disease, unspecified: Secondary | ICD-10-CM | POA: Diagnosis not present

## 2014-08-15 DIAGNOSIS — I4892 Unspecified atrial flutter: Secondary | ICD-10-CM | POA: Diagnosis not present

## 2014-08-16 DIAGNOSIS — Z7901 Long term (current) use of anticoagulants: Secondary | ICD-10-CM | POA: Diagnosis not present

## 2014-08-16 DIAGNOSIS — Z5181 Encounter for therapeutic drug level monitoring: Secondary | ICD-10-CM | POA: Diagnosis not present

## 2014-08-22 DIAGNOSIS — N183 Chronic kidney disease, stage 3 unspecified: Secondary | ICD-10-CM | POA: Diagnosis not present

## 2014-08-22 DIAGNOSIS — Z1331 Encounter for screening for depression: Secondary | ICD-10-CM | POA: Diagnosis not present

## 2014-08-22 DIAGNOSIS — I4891 Unspecified atrial fibrillation: Secondary | ICD-10-CM | POA: Diagnosis not present

## 2014-08-22 DIAGNOSIS — I10 Essential (primary) hypertension: Secondary | ICD-10-CM | POA: Diagnosis not present

## 2014-08-22 DIAGNOSIS — I509 Heart failure, unspecified: Secondary | ICD-10-CM | POA: Diagnosis not present

## 2014-08-22 DIAGNOSIS — Z0000 Encounter for general adult medical examination without abnormal findings: Secondary | ICD-10-CM | POA: Diagnosis not present

## 2014-08-22 DIAGNOSIS — I6990 Unspecified sequelae of unspecified cerebrovascular disease: Secondary | ICD-10-CM | POA: Diagnosis not present

## 2014-08-22 DIAGNOSIS — K219 Gastro-esophageal reflux disease without esophagitis: Secondary | ICD-10-CM | POA: Diagnosis not present

## 2014-08-28 ENCOUNTER — Ambulatory Visit: Payer: Medicare Other | Attending: Internal Medicine | Admitting: Physical Therapy

## 2014-08-28 DIAGNOSIS — R262 Difficulty in walking, not elsewhere classified: Secondary | ICD-10-CM | POA: Insufficient documentation

## 2014-08-28 DIAGNOSIS — IMO0001 Reserved for inherently not codable concepts without codable children: Secondary | ICD-10-CM | POA: Diagnosis not present

## 2014-08-29 DIAGNOSIS — Z1211 Encounter for screening for malignant neoplasm of colon: Secondary | ICD-10-CM | POA: Diagnosis not present

## 2014-09-02 DIAGNOSIS — Z23 Encounter for immunization: Secondary | ICD-10-CM | POA: Diagnosis not present

## 2014-09-04 ENCOUNTER — Ambulatory Visit: Payer: Medicare Other | Admitting: Physical Therapy

## 2014-09-04 DIAGNOSIS — IMO0001 Reserved for inherently not codable concepts without codable children: Secondary | ICD-10-CM | POA: Diagnosis not present

## 2014-09-04 DIAGNOSIS — R262 Difficulty in walking, not elsewhere classified: Secondary | ICD-10-CM | POA: Diagnosis not present

## 2014-09-13 DIAGNOSIS — I10 Essential (primary) hypertension: Secondary | ICD-10-CM | POA: Diagnosis not present

## 2014-09-13 DIAGNOSIS — Z7901 Long term (current) use of anticoagulants: Secondary | ICD-10-CM | POA: Diagnosis not present

## 2014-10-11 DIAGNOSIS — Z7901 Long term (current) use of anticoagulants: Secondary | ICD-10-CM | POA: Diagnosis not present

## 2014-10-11 DIAGNOSIS — Z789 Other specified health status: Secondary | ICD-10-CM | POA: Diagnosis not present

## 2014-10-18 DIAGNOSIS — I4891 Unspecified atrial fibrillation: Secondary | ICD-10-CM | POA: Diagnosis not present

## 2014-10-18 DIAGNOSIS — Z7901 Long term (current) use of anticoagulants: Secondary | ICD-10-CM | POA: Diagnosis not present

## 2014-11-15 DIAGNOSIS — Z7901 Long term (current) use of anticoagulants: Secondary | ICD-10-CM | POA: Diagnosis not present

## 2014-12-13 DIAGNOSIS — Z7901 Long term (current) use of anticoagulants: Secondary | ICD-10-CM | POA: Diagnosis not present

## 2014-12-13 DIAGNOSIS — I4891 Unspecified atrial fibrillation: Secondary | ICD-10-CM | POA: Diagnosis not present

## 2014-12-19 ENCOUNTER — Ambulatory Visit
Admission: RE | Admit: 2014-12-19 | Discharge: 2014-12-19 | Disposition: A | Discharge status: Home / Self Care | Payer: Medicare Other | Source: Ambulatory Visit | Attending: Nurse Practitioner | Admitting: Nurse Practitioner

## 2014-12-19 ENCOUNTER — Other Ambulatory Visit: Payer: Self-pay | Admitting: Nurse Practitioner

## 2014-12-19 DIAGNOSIS — J918 Pleural effusion in other conditions classified elsewhere: Secondary | ICD-10-CM | POA: Diagnosis not present

## 2014-12-19 DIAGNOSIS — J069 Acute upper respiratory infection, unspecified: Secondary | ICD-10-CM | POA: Diagnosis not present

## 2014-12-26 DIAGNOSIS — J189 Pneumonia, unspecified organism: Secondary | ICD-10-CM | POA: Diagnosis not present

## 2015-01-02 ENCOUNTER — Institutional Professional Consult (permissible substitution): Payer: Medicare Other | Admitting: Pulmonary Disease

## 2015-01-10 DIAGNOSIS — Z7901 Long term (current) use of anticoagulants: Secondary | ICD-10-CM | POA: Diagnosis not present

## 2015-01-19 DIAGNOSIS — Z9889 Other specified postprocedural states: Secondary | ICD-10-CM | POA: Diagnosis not present

## 2015-01-19 DIAGNOSIS — Q203 Discordant ventriculoarterial connection: Secondary | ICD-10-CM | POA: Diagnosis not present

## 2015-01-19 DIAGNOSIS — J929 Pleural plaque without asbestos: Secondary | ICD-10-CM | POA: Diagnosis not present

## 2015-01-19 DIAGNOSIS — I272 Other secondary pulmonary hypertension: Secondary | ICD-10-CM | POA: Diagnosis not present

## 2015-01-26 DIAGNOSIS — I519 Heart disease, unspecified: Secondary | ICD-10-CM | POA: Diagnosis not present

## 2015-01-26 DIAGNOSIS — I495 Sick sinus syndrome: Secondary | ICD-10-CM | POA: Diagnosis not present

## 2015-01-26 DIAGNOSIS — Z95810 Presence of automatic (implantable) cardiac defibrillator: Secondary | ICD-10-CM | POA: Diagnosis not present

## 2015-01-26 DIAGNOSIS — I4892 Unspecified atrial flutter: Secondary | ICD-10-CM | POA: Diagnosis not present

## 2015-02-07 DIAGNOSIS — Z7901 Long term (current) use of anticoagulants: Secondary | ICD-10-CM | POA: Diagnosis not present

## 2015-02-21 DIAGNOSIS — I272 Other secondary pulmonary hypertension: Secondary | ICD-10-CM | POA: Diagnosis not present

## 2015-02-21 DIAGNOSIS — I5022 Chronic systolic (congestive) heart failure: Secondary | ICD-10-CM | POA: Diagnosis not present

## 2015-02-21 DIAGNOSIS — I482 Chronic atrial fibrillation: Secondary | ICD-10-CM | POA: Diagnosis not present

## 2015-02-21 DIAGNOSIS — K219 Gastro-esophageal reflux disease without esophagitis: Secondary | ICD-10-CM | POA: Diagnosis not present

## 2015-02-21 DIAGNOSIS — I10 Essential (primary) hypertension: Secondary | ICD-10-CM | POA: Diagnosis not present

## 2015-02-21 DIAGNOSIS — N183 Chronic kidney disease, stage 3 (moderate): Secondary | ICD-10-CM | POA: Diagnosis not present

## 2015-02-21 DIAGNOSIS — I639 Cerebral infarction, unspecified: Secondary | ICD-10-CM | POA: Diagnosis not present

## 2015-03-07 DIAGNOSIS — I6990 Unspecified sequelae of unspecified cerebrovascular disease: Secondary | ICD-10-CM | POA: Diagnosis not present

## 2015-03-07 DIAGNOSIS — Z7901 Long term (current) use of anticoagulants: Secondary | ICD-10-CM | POA: Diagnosis not present

## 2015-03-21 DIAGNOSIS — I6350 Cerebral infarction due to unspecified occlusion or stenosis of unspecified cerebral artery: Secondary | ICD-10-CM | POA: Diagnosis not present

## 2015-03-21 DIAGNOSIS — Q203 Discordant ventriculoarterial connection: Secondary | ICD-10-CM | POA: Diagnosis not present

## 2015-03-21 DIAGNOSIS — I10 Essential (primary) hypertension: Secondary | ICD-10-CM | POA: Diagnosis not present

## 2015-03-21 DIAGNOSIS — N183 Chronic kidney disease, stage 3 (moderate): Secondary | ICD-10-CM | POA: Diagnosis not present

## 2015-03-21 DIAGNOSIS — I509 Heart failure, unspecified: Secondary | ICD-10-CM | POA: Diagnosis not present

## 2015-03-21 DIAGNOSIS — I4891 Unspecified atrial fibrillation: Secondary | ICD-10-CM | POA: Diagnosis not present

## 2015-03-21 DIAGNOSIS — J9690 Respiratory failure, unspecified, unspecified whether with hypoxia or hypercapnia: Secondary | ICD-10-CM | POA: Diagnosis not present

## 2015-03-28 DIAGNOSIS — Z7901 Long term (current) use of anticoagulants: Secondary | ICD-10-CM | POA: Diagnosis not present

## 2015-03-30 DIAGNOSIS — I272 Other secondary pulmonary hypertension: Secondary | ICD-10-CM | POA: Diagnosis not present

## 2015-03-30 DIAGNOSIS — I495 Sick sinus syndrome: Secondary | ICD-10-CM | POA: Diagnosis not present

## 2015-03-30 DIAGNOSIS — I639 Cerebral infarction, unspecified: Secondary | ICD-10-CM | POA: Diagnosis not present

## 2015-03-30 DIAGNOSIS — Q203 Discordant ventriculoarterial connection: Secondary | ICD-10-CM | POA: Diagnosis not present

## 2015-04-19 DIAGNOSIS — I272 Other secondary pulmonary hypertension: Secondary | ICD-10-CM | POA: Diagnosis not present

## 2015-04-19 DIAGNOSIS — Z9889 Other specified postprocedural states: Secondary | ICD-10-CM | POA: Diagnosis not present

## 2015-04-19 DIAGNOSIS — I519 Heart disease, unspecified: Secondary | ICD-10-CM | POA: Diagnosis not present

## 2015-04-19 DIAGNOSIS — Q203 Discordant ventriculoarterial connection: Secondary | ICD-10-CM | POA: Diagnosis not present

## 2015-04-25 DIAGNOSIS — Z7901 Long term (current) use of anticoagulants: Secondary | ICD-10-CM | POA: Diagnosis not present

## 2015-04-25 DIAGNOSIS — Z5181 Encounter for therapeutic drug level monitoring: Secondary | ICD-10-CM | POA: Diagnosis not present

## 2015-05-23 DIAGNOSIS — Z7901 Long term (current) use of anticoagulants: Secondary | ICD-10-CM | POA: Diagnosis not present

## 2015-06-20 DIAGNOSIS — Z7901 Long term (current) use of anticoagulants: Secondary | ICD-10-CM | POA: Diagnosis not present

## 2015-07-02 DIAGNOSIS — Z95810 Presence of automatic (implantable) cardiac defibrillator: Secondary | ICD-10-CM | POA: Diagnosis not present

## 2015-07-02 DIAGNOSIS — Q203 Discordant ventriculoarterial connection: Secondary | ICD-10-CM | POA: Diagnosis not present

## 2015-07-02 DIAGNOSIS — I495 Sick sinus syndrome: Secondary | ICD-10-CM | POA: Diagnosis not present

## 2015-07-02 DIAGNOSIS — I519 Heart disease, unspecified: Secondary | ICD-10-CM | POA: Diagnosis not present

## 2015-07-18 DIAGNOSIS — Z7901 Long term (current) use of anticoagulants: Secondary | ICD-10-CM | POA: Diagnosis not present

## 2015-08-15 DIAGNOSIS — Z7901 Long term (current) use of anticoagulants: Secondary | ICD-10-CM | POA: Diagnosis not present

## 2015-08-27 DIAGNOSIS — Z23 Encounter for immunization: Secondary | ICD-10-CM | POA: Diagnosis not present

## 2015-08-27 DIAGNOSIS — I639 Cerebral infarction, unspecified: Secondary | ICD-10-CM | POA: Diagnosis not present

## 2015-08-27 DIAGNOSIS — I10 Essential (primary) hypertension: Secondary | ICD-10-CM | POA: Diagnosis not present

## 2015-08-27 DIAGNOSIS — Z0000 Encounter for general adult medical examination without abnormal findings: Secondary | ICD-10-CM | POA: Diagnosis not present

## 2015-08-27 DIAGNOSIS — Z125 Encounter for screening for malignant neoplasm of prostate: Secondary | ICD-10-CM | POA: Diagnosis not present

## 2015-08-27 DIAGNOSIS — G8191 Hemiplegia, unspecified affecting right dominant side: Secondary | ICD-10-CM | POA: Diagnosis not present

## 2015-08-27 DIAGNOSIS — N183 Chronic kidney disease, stage 3 (moderate): Secondary | ICD-10-CM | POA: Diagnosis not present

## 2015-08-27 DIAGNOSIS — I5022 Chronic systolic (congestive) heart failure: Secondary | ICD-10-CM | POA: Diagnosis not present

## 2015-08-27 DIAGNOSIS — I482 Chronic atrial fibrillation: Secondary | ICD-10-CM | POA: Diagnosis not present

## 2015-08-27 DIAGNOSIS — Z9889 Other specified postprocedural states: Secondary | ICD-10-CM | POA: Diagnosis not present

## 2015-08-27 DIAGNOSIS — Z1389 Encounter for screening for other disorder: Secondary | ICD-10-CM | POA: Diagnosis not present

## 2015-09-12 DIAGNOSIS — Z7901 Long term (current) use of anticoagulants: Secondary | ICD-10-CM | POA: Diagnosis not present

## 2015-09-13 DIAGNOSIS — Z1211 Encounter for screening for malignant neoplasm of colon: Secondary | ICD-10-CM | POA: Diagnosis not present

## 2015-09-13 NOTE — Patient Outreach (Signed)
[place] [facility]) Care Management  09/13/2015  Jacob Nunez 10-12-1961 [id number]   Referral from [employer] Tier 2 List, assigned Jon Billings, RN to outreach for [place] Management services.  Thanks, [name]. [place], [place] Assistant Phone: [phone] Fax: [phone]

## 2015-09-14 ENCOUNTER — Other Ambulatory Visit: Payer: Self-pay

## 2015-09-14 NOTE — Patient Outreach (Signed)
[place] [facility]) Care Management  09/14/2015  KESTON SEEVER Apr 27, 1961 [id number]   First telephone call to patient regarding Tier 2 referral.  No answer.  HIPAA compliant message left.   Plan: RN Health Coach will attempt telephone outreach within 1-2 weeks.    Jone Baseman, RN, MSN [place] [phone]

## 2015-09-18 ENCOUNTER — Other Ambulatory Visit: Payer: Self-pay

## 2015-09-18 NOTE — Patient Outreach (Signed)
[place] [facility]) Care Management  09/18/2015  Jacob Nunez [birth year] [id number]   Telephone call to patient for Tier 2 referral. His mother Clarice answered stating patient is at daycare and she is his caregiver.  Explained that health coach was calling from [facility]. She immediately states that they are not interested.  However, when offered to send [facility] information out she was agreeable.    Plan: RN Health coach will send letter and brochure about [facility].   RN Health Coach will send in basket to [place] for case closure.  Jone Baseman, RN, MSN [place] [phone]

## 2015-10-01 DIAGNOSIS — Z95810 Presence of automatic (implantable) cardiac defibrillator: Secondary | ICD-10-CM | POA: Diagnosis not present

## 2015-10-01 DIAGNOSIS — Q203 Discordant ventriculoarterial connection: Secondary | ICD-10-CM | POA: Diagnosis not present

## 2015-10-01 DIAGNOSIS — I495 Sick sinus syndrome: Secondary | ICD-10-CM | POA: Diagnosis not present

## 2015-10-03 NOTE — Patient Outreach (Signed)
[place] [facility]) Care Management  10/03/2015  Jacob Nunez [birth year] [id number]   Notification from Jon Billings, RN to close case due to patient refused [place] Management services.  Thanks, Jacob Nunez. [place], [place] Assistant Phone: [phone] Fax: [phone]

## 2015-10-05 DIAGNOSIS — Q203 Discordant ventriculoarterial connection: Secondary | ICD-10-CM | POA: Diagnosis not present

## 2015-10-05 DIAGNOSIS — Z95810 Presence of automatic (implantable) cardiac defibrillator: Secondary | ICD-10-CM | POA: Diagnosis not present

## 2015-10-05 DIAGNOSIS — I484 Atypical atrial flutter: Secondary | ICD-10-CM | POA: Diagnosis not present

## 2015-10-10 DIAGNOSIS — Z7901 Long term (current) use of anticoagulants: Secondary | ICD-10-CM | POA: Diagnosis not present

## 2015-10-18 DIAGNOSIS — I484 Atypical atrial flutter: Secondary | ICD-10-CM | POA: Diagnosis not present

## 2015-10-18 DIAGNOSIS — Z95810 Presence of automatic (implantable) cardiac defibrillator: Secondary | ICD-10-CM | POA: Diagnosis not present

## 2015-10-18 DIAGNOSIS — Q203 Discordant ventriculoarterial connection: Secondary | ICD-10-CM | POA: Diagnosis not present

## 2015-10-18 DIAGNOSIS — R5383 Other fatigue: Secondary | ICD-10-CM | POA: Diagnosis not present

## 2015-10-18 DIAGNOSIS — I5022 Chronic systolic (congestive) heart failure: Secondary | ICD-10-CM | POA: Diagnosis not present

## 2015-11-07 DIAGNOSIS — Z7901 Long term (current) use of anticoagulants: Secondary | ICD-10-CM | POA: Diagnosis not present

## 2015-12-05 DIAGNOSIS — Z7901 Long term (current) use of anticoagulants: Secondary | ICD-10-CM | POA: Diagnosis not present

## 2016-01-02 DIAGNOSIS — Z7901 Long term (current) use of anticoagulants: Secondary | ICD-10-CM | POA: Diagnosis not present

## 2016-01-30 DIAGNOSIS — Z7901 Long term (current) use of anticoagulants: Secondary | ICD-10-CM | POA: Diagnosis not present

## 2016-02-25 DIAGNOSIS — I5022 Chronic systolic (congestive) heart failure: Secondary | ICD-10-CM | POA: Diagnosis not present

## 2016-02-25 DIAGNOSIS — I10 Essential (primary) hypertension: Secondary | ICD-10-CM | POA: Diagnosis not present

## 2016-02-25 DIAGNOSIS — N183 Chronic kidney disease, stage 3 (moderate): Secondary | ICD-10-CM | POA: Diagnosis not present

## 2016-02-25 DIAGNOSIS — Z7901 Long term (current) use of anticoagulants: Secondary | ICD-10-CM | POA: Diagnosis not present

## 2016-02-25 DIAGNOSIS — I482 Chronic atrial fibrillation: Secondary | ICD-10-CM | POA: Diagnosis not present

## 2016-02-25 DIAGNOSIS — I639 Cerebral infarction, unspecified: Secondary | ICD-10-CM | POA: Diagnosis not present

## 2016-02-25 DIAGNOSIS — G8191 Hemiplegia, unspecified affecting right dominant side: Secondary | ICD-10-CM | POA: Diagnosis not present

## 2016-02-25 DIAGNOSIS — K219 Gastro-esophageal reflux disease without esophagitis: Secondary | ICD-10-CM | POA: Diagnosis not present

## 2016-03-11 DIAGNOSIS — Z1211 Encounter for screening for malignant neoplasm of colon: Secondary | ICD-10-CM | POA: Diagnosis not present

## 2016-03-11 DIAGNOSIS — Z1212 Encounter for screening for malignant neoplasm of rectum: Secondary | ICD-10-CM | POA: Diagnosis not present

## 2016-03-12 DIAGNOSIS — Z7901 Long term (current) use of anticoagulants: Secondary | ICD-10-CM | POA: Diagnosis not present

## 2016-03-25 ENCOUNTER — Encounter (HOSPITAL_COMMUNITY): Payer: Self-pay

## 2016-03-25 ENCOUNTER — Emergency Department (HOSPITAL_COMMUNITY): Payer: Medicare Other

## 2016-03-25 ENCOUNTER — Emergency Department (HOSPITAL_COMMUNITY)
Admission: EM | Admit: 2016-03-25 | Discharge: 2016-03-25 | Disposition: A | Discharge status: Home / Self Care | Payer: Medicare Other | Attending: Emergency Medicine | Admitting: Emergency Medicine

## 2016-03-25 DIAGNOSIS — I10 Essential (primary) hypertension: Secondary | ICD-10-CM | POA: Diagnosis not present

## 2016-03-25 DIAGNOSIS — Z8719 Personal history of other diseases of the digestive system: Secondary | ICD-10-CM | POA: Insufficient documentation

## 2016-03-25 DIAGNOSIS — Z7901 Long term (current) use of anticoagulants: Secondary | ICD-10-CM | POA: Insufficient documentation

## 2016-03-25 DIAGNOSIS — Z79899 Other long term (current) drug therapy: Secondary | ICD-10-CM | POA: Diagnosis not present

## 2016-03-25 DIAGNOSIS — Y92210 Daycare center as the place of occurrence of the external cause: Secondary | ICD-10-CM | POA: Insufficient documentation

## 2016-03-25 DIAGNOSIS — Z95810 Presence of automatic (implantable) cardiac defibrillator: Secondary | ICD-10-CM | POA: Diagnosis not present

## 2016-03-25 DIAGNOSIS — R791 Abnormal coagulation profile: Secondary | ICD-10-CM

## 2016-03-25 DIAGNOSIS — S20219A Contusion of unspecified front wall of thorax, initial encounter: Secondary | ICD-10-CM | POA: Insufficient documentation

## 2016-03-25 DIAGNOSIS — J90 Pleural effusion, not elsewhere classified: Secondary | ICD-10-CM | POA: Diagnosis not present

## 2016-03-25 DIAGNOSIS — Y9389 Activity, other specified: Secondary | ICD-10-CM | POA: Diagnosis not present

## 2016-03-25 DIAGNOSIS — S279XXA Injury of unspecified intrathoracic organ, initial encounter: Secondary | ICD-10-CM | POA: Diagnosis not present

## 2016-03-25 DIAGNOSIS — W19XXXA Unspecified fall, initial encounter: Secondary | ICD-10-CM

## 2016-03-25 DIAGNOSIS — Y998 Other external cause status: Secondary | ICD-10-CM | POA: Diagnosis not present

## 2016-03-25 DIAGNOSIS — S29001A Unspecified injury of muscle and tendon of front wall of thorax, initial encounter: Secondary | ICD-10-CM | POA: Diagnosis present

## 2016-03-25 DIAGNOSIS — R220 Localized swelling, mass and lump, head: Secondary | ICD-10-CM | POA: Diagnosis not present

## 2016-03-25 DIAGNOSIS — W01198A Fall on same level from slipping, tripping and stumbling with subsequent striking against other object, initial encounter: Secondary | ICD-10-CM | POA: Diagnosis not present

## 2016-03-25 DIAGNOSIS — I509 Heart failure, unspecified: Secondary | ICD-10-CM | POA: Insufficient documentation

## 2016-03-25 DIAGNOSIS — R079 Chest pain, unspecified: Secondary | ICD-10-CM | POA: Diagnosis not present

## 2016-03-25 DIAGNOSIS — Z8673 Personal history of transient ischemic attack (TIA), and cerebral infarction without residual deficits: Secondary | ICD-10-CM | POA: Diagnosis not present

## 2016-03-25 DIAGNOSIS — I2510 Atherosclerotic heart disease of native coronary artery without angina pectoris: Secondary | ICD-10-CM | POA: Insufficient documentation

## 2016-03-25 LAB — PROTIME-INR
INR: 4.21 — AB (ref 0.00–1.49)
Prothrombin Time: 39.5 seconds — ABNORMAL HIGH (ref 11.6–15.2)

## 2016-03-25 NOTE — ED Notes (Addendum)
Per EMS - pt hx short term memory loss. Pt was at adult daycare today, tripped and fell. Pt did hit head but no LOC. Pt denies any dizziness/blurred vision. Pt denies head/neck/back pain. Pt chest landed on O2 container in fall and now c/o chest wall pain, tender to palpation. wears 2L [place] at home. 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   Pt takes coumadin

## 2016-03-25 NOTE — ED Provider Notes (Signed)
CSN: [id number]     Arrival date & time 03/25/16  1119 History   First MD Initiated Contact with Patient 03/25/16 1123     Chief Complaint  Patient presents with  . Fall   Patient is a 55 y.o. male presenting with fall.  Fall     Jacob Nunez is an 55 y.o. male with history of stroke (on coumadin), CAD, CHF, HTN, short term memory loss who presents to the ED for evaluation after a fall. He was at adult daycare and reports he was trying to catch a ball when he tripped and fell. He states he hit his head on the right side of his forehead but denies LOC. Denies headache, visual changes, N/V. He wears O2 via [place] at baseline and states his chest hit his O2 tank and now has pain in his lower sternum. Denies SOB. Denies any other pain. He is accompanied by his mother who agrees his mentation is otherwise at baseline. They are unsure of his last INR. He does have some residual weakness and motor deficits on his right side from prior stroke.   Past Medical History  Diagnosis Date  . Stroke ([place])   . Arrhythmia   . Coronary artery disease   . CHF (congestive heart failure) ([place])   . GERD (gastroesophageal reflux disease)   . Hypertension   . Weakness of right leg   . Weakness of right arm    Past Surgical History  Procedure Laterality Date  . Pacemaker insertion    . Cardiac defibrillator placement    . Cardiac defibrillator placement     History reviewed. No pertinent family history. Social History  Substance Use Topics  . Smoking status: Never Smoker   . Smokeless tobacco: None  . Alcohol Use: No    Review of Systems  All other systems reviewed and are negative.     Allergies  Zithromax  Home Medications   Prior to Admission medications   Medication Sig Start Date End Date Taking? Authorizing Provider  albuterol (PROVENTIL) (2.5 MG/3ML) 0.083% nebulizer solution Take 5 mg by nebulization.      Historical Provider, MD  carvedilol (COREG) 6.25 MG tablet Take 6.25 mg by mouth  2 (two) times daily with a meal.      Historical Provider, MD  famotidine (PEPCID) 20 MG tablet Take 40 mg by mouth at bedtime.      Historical Provider, MD  furosemide (LASIX) 40 MG tablet Take 40 mg by mouth daily.      Historical Provider, MD  lisinopril (PRINIVIL,ZESTRIL) 20 MG tablet Take 20 mg by mouth daily.      Historical Provider, MD  potassium chloride (K-DUR) 10 MEQ tablet Take 10 mEq by mouth 2 (two) times daily.      Historical Provider, MD  sotalol (BETAPACE) 80 MG tablet Take 80 mg by mouth every 12 (twelve) hours.      Historical Provider, MD  warfarin (COUMADIN) 5 MG tablet Take 2.5-5 mg by mouth daily. 2.5 mg on Sunday, Tuesday, Friday, ; 5 mg on Monday, Wednesday, Thursday, Saturday     Historical Provider, MD   BP 119/61 mmHg  Pulse 64  Temp(Src) 98.5 F (36.9 C) (Oral)  Resp 22  SpO2 96% Physical Exam  Constitutional: He is oriented to person, place, and time.  HENT:  Right Ear: External ear normal.  Left Ear: External ear normal.  Nose: Nose normal.  Mouth/Throat: Oropharynx is clear and moist. No oropharyngeal exudate.  Few  small abrasions above right eyebrow with no surround edema or erythema. No bleeding. Nontender. No other facial tenderness or crepitus.  Eyes: Conjunctivae and EOM are normal. Pupils are equal, round, and reactive to light.  Neck: Normal range of motion. Neck supple.  Cardiovascular: Normal rate, regular rhythm, normal heart sounds and intact distal pulses.   Pulmonary/Chest: Effort normal. No respiratory distress. He has no wheezes. He exhibits tenderness.    Few scattered rhonchi in bilateral lung bases that clear with coughing  Abdominal: Soft. Bowel sounds are normal. He exhibits no distension. There is no tenderness.  Musculoskeletal: He exhibits no edema.  No lower extremity edema  Neurological: He is alert and oriented to person, place, and time. No cranial nerve deficit.  Skin: Skin is warm and dry.  Psychiatric: He has a normal  mood and affect.  Nursing note and vitals reviewed.   ED Course  Procedures (including critical care time) Labs Review Labs Reviewed  PROTIME-INR - Abnormal; Notable for the following:    Prothrombin Time 39.5 (*)    INR 4.21 (*)    All other components within normal limits    Imaging Review Dg Chest 2 View  03/25/2016  CLINICAL DATA:  Status post trip and fall today with a blow to the chest. Initial encounter. EXAM: CHEST  2 VIEW COMPARISON:  PA and lateral chest 12/19/2014, 05/26/2012 and 07/14/2010. CT chest 07/14/2010. FINDINGS: There is cardiomegaly. Pacing device is unchanged. A chronic small right pleural effusion is also unchanged. Hazy bilateral pulmonary opacities appear slightly worse than on the most recent examination consistent with some increase in chronic pulmonary edema. No pneumothorax is identified. No focal bony abnormality is seen. IMPRESSION: Negative for evidence of trauma. Chronic pulmonary edema appears somewhat worst than on the most recent examination. No change in cardiomegaly and a small, chronic right pleural effusion. Electronically Signed   By: Inge Rise M.D.   On: 03/25/2016 12:24   Ct Head Wo Contrast  03/25/2016  CLINICAL DATA:  [place] this morning, RIGHT supraorbital swelling, RIGHT frontal abrasion, on Coumadin, denies loss of consciousness, personal history of coronary artery disease, CHF, hypertension, stroke EXAM: CT HEAD WITHOUT CONTRAST TECHNIQUE: Contiguous axial images were obtained from the base of the skull through the vertex without intravenous contrast. COMPARISON:  None. FINDINGS: Generalized atrophy. Normal ventricular morphology. No midline shift or mass effect. Old BILATERAL frontal infarcts larger on LEFT. Old infarct LEFT basal ganglia. No intracranial hemorrhage, mass lesion or evidence of acute infarction. No extra-axial fluid collections. Fluid RIGHT maxillary sinus. Remaining visualized paranasal sinuses and mastoid air cells clear.  Intraorbital soft tissue planes clear. No fractures. IMPRESSION: Atrophy with large old bifrontal infarcts LEFT greater than RIGHT extending into LEFT basal ganglia. No acute intracranial abnormalities. Electronically Signed   By: Lavonia Dana M.D.   On: 03/25/2016 12:44   I have personally reviewed and evaluated these images and lab results as part of my medical decision-making.   EKG Interpretation None      MDM   Final diagnoses:  Fall, initial encounter  Chest wall contusion, unspecified laterality, initial encounter  Supratherapeutic INR    Pt presents after mechanical fall. He is on coumadin. CT head negative for acute abnormality. There are old large bifrontal infarcts and atrophy. CXR is negative for acute findings. Perhaps some increased in pulmonary edema from prior. Pt has no edema on exam. Denies SOB. His INR is supratherapeutic to 4.21. I instructed pt to hold couple doses of coumadin and  call PCP for f/u. Will need INR recheck and management of regimen. He is ambulatory with steady gait. ER return precautions given.     Anne Ng, PA-C 03/25/16 [place], DO 03/25/16 [phone]

## 2016-03-25 NOTE — ED Notes (Signed)
Pt POA verbalized understanding of d/c instructions and follow-up care. No further questions/concerns, VSS, ambulatory w/ steady gait (refused wheelchair)

## 2016-03-25 NOTE — Discharge Instructions (Signed)
Your CT scan and chest x-ray were negative. Your INR was higher than it should be today at 4.21. Please skip your next TWO doses of Coumadin. Please call your primary care provider to schedule a follow up appointment as soon as possible. They will need to re-check your INR as well. You may take tylenol as needed for pain. Return to the ER for new or worsening symptoms.

## 2016-03-27 DIAGNOSIS — Z7901 Long term (current) use of anticoagulants: Secondary | ICD-10-CM | POA: Diagnosis not present

## 2016-04-02 ENCOUNTER — Other Ambulatory Visit: Payer: Self-pay | Admitting: Internal Medicine

## 2016-04-02 ENCOUNTER — Ambulatory Visit
Admission: RE | Admit: 2016-04-02 | Discharge: 2016-04-02 | Disposition: A | Discharge status: Home / Self Care | Payer: Medicare Other | Source: Ambulatory Visit | Attending: Internal Medicine | Admitting: Internal Medicine

## 2016-04-02 DIAGNOSIS — R05 Cough: Secondary | ICD-10-CM | POA: Diagnosis not present

## 2016-04-02 DIAGNOSIS — I5022 Chronic systolic (congestive) heart failure: Secondary | ICD-10-CM | POA: Diagnosis not present

## 2016-04-02 DIAGNOSIS — R059 Cough, unspecified: Secondary | ICD-10-CM

## 2016-04-02 DIAGNOSIS — Z7901 Long term (current) use of anticoagulants: Secondary | ICD-10-CM | POA: Diagnosis not present

## 2016-04-03 DIAGNOSIS — H5203 Hypermetropia, bilateral: Secondary | ICD-10-CM | POA: Diagnosis not present

## 2016-04-03 DIAGNOSIS — H524 Presbyopia: Secondary | ICD-10-CM | POA: Diagnosis not present

## 2016-04-03 DIAGNOSIS — H5340 Unspecified visual field defects: Secondary | ICD-10-CM | POA: Diagnosis not present

## 2016-04-03 DIAGNOSIS — H01004 Unspecified blepharitis left upper eyelid: Secondary | ICD-10-CM | POA: Diagnosis not present

## 2016-04-03 DIAGNOSIS — H01001 Unspecified blepharitis right upper eyelid: Secondary | ICD-10-CM | POA: Diagnosis not present

## 2016-04-07 DIAGNOSIS — Z7901 Long term (current) use of anticoagulants: Secondary | ICD-10-CM | POA: Diagnosis not present

## 2016-04-09 DIAGNOSIS — H109 Unspecified conjunctivitis: Secondary | ICD-10-CM | POA: Diagnosis not present

## 2016-04-14 DIAGNOSIS — Z7901 Long term (current) use of anticoagulants: Secondary | ICD-10-CM | POA: Diagnosis not present

## 2016-04-28 DIAGNOSIS — C44722 Squamous cell carcinoma of skin of right lower limb, including hip: Secondary | ICD-10-CM | POA: Diagnosis not present

## 2016-04-28 DIAGNOSIS — C44619 Basal cell carcinoma of skin of left upper limb, including shoulder: Secondary | ICD-10-CM | POA: Diagnosis not present

## 2016-04-28 DIAGNOSIS — D485 Neoplasm of uncertain behavior of skin: Secondary | ICD-10-CM | POA: Diagnosis not present

## 2016-04-28 DIAGNOSIS — C44629 Squamous cell carcinoma of skin of left upper limb, including shoulder: Secondary | ICD-10-CM | POA: Diagnosis not present

## 2016-04-28 DIAGNOSIS — C44712 Basal cell carcinoma of skin of right lower limb, including hip: Secondary | ICD-10-CM | POA: Diagnosis not present

## 2016-04-28 DIAGNOSIS — D2262 Melanocytic nevi of left upper limb, including shoulder: Secondary | ICD-10-CM | POA: Diagnosis not present

## 2016-04-28 DIAGNOSIS — L570 Actinic keratosis: Secondary | ICD-10-CM | POA: Diagnosis not present

## 2016-04-28 DIAGNOSIS — D239 Other benign neoplasm of skin, unspecified: Secondary | ICD-10-CM | POA: Diagnosis not present

## 2016-04-30 DIAGNOSIS — Z7901 Long term (current) use of anticoagulants: Secondary | ICD-10-CM | POA: Diagnosis not present

## 2016-05-15 DIAGNOSIS — I5020 Unspecified systolic (congestive) heart failure: Secondary | ICD-10-CM | POA: Diagnosis not present

## 2016-05-15 DIAGNOSIS — I484 Atypical atrial flutter: Secondary | ICD-10-CM | POA: Diagnosis not present

## 2016-05-15 DIAGNOSIS — I5022 Chronic systolic (congestive) heart failure: Secondary | ICD-10-CM | POA: Diagnosis not present

## 2016-05-15 DIAGNOSIS — Q203 Discordant ventriculoarterial connection: Secondary | ICD-10-CM | POA: Diagnosis not present

## 2016-05-15 DIAGNOSIS — Z9889 Other specified postprocedural states: Secondary | ICD-10-CM | POA: Diagnosis not present

## 2016-05-28 DIAGNOSIS — Z7901 Long term (current) use of anticoagulants: Secondary | ICD-10-CM | POA: Diagnosis not present

## 2016-05-29 DIAGNOSIS — C44722 Squamous cell carcinoma of skin of right lower limb, including hip: Secondary | ICD-10-CM | POA: Diagnosis not present

## 2016-05-29 DIAGNOSIS — C44629 Squamous cell carcinoma of skin of left upper limb, including shoulder: Secondary | ICD-10-CM | POA: Diagnosis not present

## 2016-06-09 ENCOUNTER — Emergency Department (HOSPITAL_COMMUNITY)
Admission: EM | Admit: 2016-06-09 | Discharge: 2016-06-09 | Disposition: A | Discharge status: Home / Self Care | Payer: Medicare Other | Attending: Emergency Medicine | Admitting: Emergency Medicine

## 2016-06-09 ENCOUNTER — Emergency Department (HOSPITAL_COMMUNITY): Payer: Medicare Other

## 2016-06-09 ENCOUNTER — Encounter (HOSPITAL_COMMUNITY): Payer: Self-pay | Admitting: Family Medicine

## 2016-06-09 DIAGNOSIS — Y929 Unspecified place or not applicable: Secondary | ICD-10-CM | POA: Diagnosis not present

## 2016-06-09 DIAGNOSIS — Z79899 Other long term (current) drug therapy: Secondary | ICD-10-CM | POA: Diagnosis not present

## 2016-06-09 DIAGNOSIS — Z7901 Long term (current) use of anticoagulants: Secondary | ICD-10-CM | POA: Diagnosis not present

## 2016-06-09 DIAGNOSIS — I110 Hypertensive heart disease with heart failure: Secondary | ICD-10-CM | POA: Insufficient documentation

## 2016-06-09 DIAGNOSIS — R51 Headache: Secondary | ICD-10-CM | POA: Diagnosis not present

## 2016-06-09 DIAGNOSIS — H05231 Hemorrhage of right orbit: Secondary | ICD-10-CM | POA: Diagnosis not present

## 2016-06-09 DIAGNOSIS — S40211A Abrasion of right shoulder, initial encounter: Secondary | ICD-10-CM | POA: Diagnosis not present

## 2016-06-09 DIAGNOSIS — W1809XA Striking against other object with subsequent fall, initial encounter: Secondary | ICD-10-CM | POA: Insufficient documentation

## 2016-06-09 DIAGNOSIS — I509 Heart failure, unspecified: Secondary | ICD-10-CM | POA: Insufficient documentation

## 2016-06-09 DIAGNOSIS — S0990XA Unspecified injury of head, initial encounter: Secondary | ICD-10-CM | POA: Diagnosis not present

## 2016-06-09 DIAGNOSIS — S01111A Laceration without foreign body of right eyelid and periocular area, initial encounter: Secondary | ICD-10-CM | POA: Diagnosis not present

## 2016-06-09 DIAGNOSIS — Z8673 Personal history of transient ischemic attack (TIA), and cerebral infarction without residual deficits: Secondary | ICD-10-CM | POA: Insufficient documentation

## 2016-06-09 DIAGNOSIS — I2510 Atherosclerotic heart disease of native coronary artery without angina pectoris: Secondary | ICD-10-CM | POA: Insufficient documentation

## 2016-06-09 DIAGNOSIS — S0993XA Unspecified injury of face, initial encounter: Secondary | ICD-10-CM | POA: Diagnosis present

## 2016-06-09 DIAGNOSIS — Y9301 Activity, walking, marching and hiking: Secondary | ICD-10-CM | POA: Diagnosis not present

## 2016-06-09 DIAGNOSIS — S064X0A Epidural hemorrhage without loss of consciousness, initial encounter: Secondary | ICD-10-CM | POA: Diagnosis not present

## 2016-06-09 DIAGNOSIS — Z950 Presence of cardiac pacemaker: Secondary | ICD-10-CM | POA: Diagnosis not present

## 2016-06-09 DIAGNOSIS — S098XXA Other specified injuries of head, initial encounter: Secondary | ICD-10-CM | POA: Diagnosis not present

## 2016-06-09 DIAGNOSIS — Y999 Unspecified external cause status: Secondary | ICD-10-CM | POA: Diagnosis not present

## 2016-06-09 DIAGNOSIS — W19XXXA Unspecified fall, initial encounter: Secondary | ICD-10-CM

## 2016-06-09 MED ORDER — BACITRACIN ZINC 500 UNIT/GM EX OINT: TOPICAL_OINTMENT | Freq: Two times a day (BID) | CUTANEOUS | Status: DC

## 2016-06-09 NOTE — ED Notes (Addendum)
Pt was walking outside and tripped and fell on curb. Small laceration to right eye with hematoma. Pt on blood thinners. No dizziness, weakness, LOC. Denies pain.

## 2016-06-09 NOTE — Discharge Instructions (Signed)
Mr. BRIGHTON LY,  Nice meeting you! Please follow-up with your primary care provider. You may take Tylenol for your pain. You will be sore for a few days. Return to the emergency department if you develop dizziness, headaches, increased pain, nausea/vomiting, loss of bladder/bowel control, new/worsening symptoms. Feel better soon!  S. Wendie Simmer, PA-C

## 2016-06-09 NOTE — ED Provider Notes (Signed)
CSN: [id number]     Arrival date & time 06/09/16  1640 History   First MD Initiated Contact with Patient 06/09/16 1842     Chief Complaint  Patient presents with  . Fall   HPI  Jacob Nunez is a 55 y.o. male PMH significant for stroke, coronary artery disease, CHF, hypertension, pacemaker presenting status post mechanical fall. He states he was walking outside and tripped over a curb. He endorses a right eye hematoma and right shoulder abrasion. No loss of consciousness, dizziness, weakness, shortness of breath, nausea, vomiting, loss of bowel or bladder control, pain currently.  Past Medical History  Diagnosis Date  . Stroke ([place])   . Arrhythmia   . Coronary artery disease   . CHF (congestive heart failure) ([place])   . GERD (gastroesophageal reflux disease)   . Hypertension   . Weakness of right leg   . Weakness of right arm    Past Surgical History  Procedure Laterality Date  . Pacemaker insertion    . Cardiac defibrillator placement    . Cardiac defibrillator placement     History reviewed. No pertinent family history. Social History  Substance Use Topics  . Smoking status: Never Smoker   . Smokeless tobacco: None  . Alcohol Use: No    Review of Systems  Ten systems are reviewed and are negative for acute change except as noted in the HPI  Allergies  Zithromax  Home Medications   Prior to Admission medications   Medication Sig Start Date End Date Taking? Authorizing Provider  carvedilol (COREG) 6.25 MG tablet Take 3.125-6.25 mg by mouth 2 (two) times daily with a meal. Takes 1/2 tab in am and 2 tabs in pm   Yes Historical Provider, MD  famotidine (PEPCID) 20 MG tablet Take 40 mg by mouth at bedtime.     Yes Historical Provider, MD  furosemide (LASIX) 40 MG tablet Take 40 mg by mouth daily.     Yes Historical Provider, MD  lisinopril (PRINIVIL,ZESTRIL) 20 MG tablet Take 20 mg by mouth daily.     Yes Historical Provider, MD  OXYGEN Inhale 2 L into the lungs  continuous.   Yes Historical Provider, MD  sotalol (BETAPACE) 80 MG tablet Take 80 mg by mouth every 12 (twelve) hours.     Yes Historical Provider, MD  warfarin (COUMADIN) 5 MG tablet Take 2.5-5 mg by mouth at bedtime. 2.5 mg on Sunday, Tuesday, Friday, ; 5 mg on Monday, Wednesday, Thursday, Saturday   Yes Historical Provider, MD  potassium chloride (K-DUR) 10 MEQ tablet Take 10 mEq by mouth 2 (two) times daily. Reported on 06/09/2016    Historical Provider, MD   BP 144/73 mmHg  Pulse 62  Resp 18  SpO2 93% Physical Exam  Constitutional: He appears well-developed. No distress.  Chronically ill-appearing  HENT:  Head: Normocephalic and atraumatic.  Mouth/Throat: Oropharynx is clear and moist. No oropharyngeal exudate.  No hemotympanum. No nasal hematoma.  Eyes: Conjunctivae and EOM are normal. Pupils are equal, round, and reactive to light. Right eye exhibits no discharge. Left eye exhibits no discharge. No scleral icterus.  Neck: Normal range of motion. No tracheal deviation present.  Cardiovascular: Intact distal pulses.   Pulmonary/Chest: Effort normal and breath sounds normal. No respiratory distress. He has no wheezes. He has no rales. He exhibits no tenderness.  Abdominal: Soft. Bowel sounds are normal. He exhibits no distension and no mass. There is no tenderness. There is no rebound and no guarding.  Musculoskeletal: Normal range of motion. He exhibits no edema.  No midline cervical, thoracic, lumbar spinal tenderness. Strength 5/5 throughout. Neurovascularly intact bilaterally.  Lymphadenopathy:    He has no cervical adenopathy.  Neurological: He is alert. Coordination normal.  Cranial nerves 2-12 grossly intact.   Skin: Skin is warm and dry. No rash noted. He is not diaphoretic. No erythema.  Right supraorbital hematoma with overlying 1.5 cm laceration. Right shoulder abrasion.  Psychiatric: He has a normal mood and affect. His behavior is normal.  Nursing note and vitals  reviewed.   ED Course  .Marland KitchenLaceration Repair Date/Time: 06/09/2016 8:29 PM Performed by: Alisia Ferrari NICOLE Authorized by: Codington Lions Consent: Verbal consent obtained. Risks and benefits: risks, benefits and alternatives were discussed Consent given by: patient and parent Patient understanding: patient states understanding of the procedure being performed Patient identity confirmed: verbally with patient and arm band Body area: head/neck Location details: right eyebrow Laceration length: 1.5 cm Foreign bodies: no foreign bodies Tendon involvement: none Nerve involvement: none Vascular damage: no Skin closure: glue and Steri-Strips Approximation: close Approximation difficulty: simple Patient tolerance: Patient tolerated the procedure well with no immediate complications      Imaging Review Ct Head Wo Contrast  06/09/2016  CLINICAL DATA:  Pain following fall EXAM: CT HEAD WITHOUT CONTRAST TECHNIQUE: Contiguous axial images were obtained from the base of the skull through the vertex without intravenous contrast. COMPARISON:  March 25, 2016 FINDINGS: Brain: Mild diffuse atrophy is stable. There is mild encephalomalacia involving the frontal horn of the left lateral ventricle, stable. There is no intracranial mass, acute hemorrhage, extra-axial fluid collection, or midline shift. There is evidence of a prior infarct on the left involving the posterior left frontal and superior left temporal lobes. There is a mid right frontal lobe infarct extending medially to the vertex in the right frontal region. There is patchy small vessel disease in the centra semiovale, primarily anteriorly, stable. There is a small calcification posterior to the left temporal lobe infarct, either a granuloma or residua of previous hemorrhage, stable. No acute infarct evident. Vascular: There is slight calcification in the cavernous carotid artery on the left. Skull: Bony calvarium appears intact.  Sinuses/Orbits: Visualized orbits appear symmetric bilaterally. There is mucosal thickening with probable retention cysts in the right maxillary antrum posteriorly, a finding also present on prior study. The paranasal sinuses are clear. There is leftward deviation the nasal septum. There is a prominent concha bullosa on the right, an anatomic variant. Other: Mastoids on the right are somewhat hypoplastic. Mastoids bilaterally are clear. IMPRESSION: Atrophy with prior infarcts and small vessel disease, stable. No acute infarct evident. No acute hemorrhage or mass effect. Small calcification in the left superior temporal lobe consistent with either a small granuloma or residua of prior hemorrhage. Slight calcification in the left cavernous carotid artery. Right maxillary sinus disease.  Leftward deviation of nasal septum. Electronically Signed   By: Lowella Grip III M.D.   On: 06/09/2016 17:30   I have personally reviewed and evaluated these images and lab results as part of my medical decision-making.  MDM   Final diagnoses:  Fall, initial encounter  Head injury, initial encounter   CT head reveals no acute changes. Laceration repaired with glue and steristrips. Pt tolerated well. Patient's parents, present at bedside, would like to hold off on right shoulder imaging at this time and INR check given patient just had INR checked last week at primary care provider's office. Patient may be safely  discharged home. Discussed reasons for return. Patient to follow-up with primary care provider within one week. Patient in understanding and agreement with the plan.   Laconia Lions, PA-C 06/09/16 2032  Merrily Pew, MD 06/09/16 [phone]

## 2016-06-18 DIAGNOSIS — Z7901 Long term (current) use of anticoagulants: Secondary | ICD-10-CM | POA: Diagnosis not present

## 2016-07-02 DIAGNOSIS — Z7901 Long term (current) use of anticoagulants: Secondary | ICD-10-CM | POA: Diagnosis not present

## 2016-07-16 DIAGNOSIS — I480 Paroxysmal atrial fibrillation: Secondary | ICD-10-CM | POA: Diagnosis not present

## 2016-07-23 DIAGNOSIS — Z7901 Long term (current) use of anticoagulants: Secondary | ICD-10-CM | POA: Diagnosis not present

## 2016-08-06 DIAGNOSIS — Z7901 Long term (current) use of anticoagulants: Secondary | ICD-10-CM | POA: Diagnosis not present

## 2016-08-13 DIAGNOSIS — Z7901 Long term (current) use of anticoagulants: Secondary | ICD-10-CM | POA: Diagnosis not present

## 2016-08-29 DIAGNOSIS — R5383 Other fatigue: Secondary | ICD-10-CM | POA: Diagnosis not present

## 2016-08-29 DIAGNOSIS — K219 Gastro-esophageal reflux disease without esophagitis: Secondary | ICD-10-CM | POA: Diagnosis not present

## 2016-08-29 DIAGNOSIS — I639 Cerebral infarction, unspecified: Secondary | ICD-10-CM | POA: Diagnosis not present

## 2016-08-29 DIAGNOSIS — Z125 Encounter for screening for malignant neoplasm of prostate: Secondary | ICD-10-CM | POA: Diagnosis not present

## 2016-08-29 DIAGNOSIS — I5022 Chronic systolic (congestive) heart failure: Secondary | ICD-10-CM | POA: Diagnosis not present

## 2016-08-29 DIAGNOSIS — N183 Chronic kidney disease, stage 3 (moderate): Secondary | ICD-10-CM | POA: Diagnosis not present

## 2016-08-29 DIAGNOSIS — I10 Essential (primary) hypertension: Secondary | ICD-10-CM | POA: Diagnosis not present

## 2016-08-29 DIAGNOSIS — Z23 Encounter for immunization: Secondary | ICD-10-CM | POA: Diagnosis not present

## 2016-08-29 DIAGNOSIS — Z1159 Encounter for screening for other viral diseases: Secondary | ICD-10-CM | POA: Diagnosis not present

## 2016-08-29 DIAGNOSIS — I482 Chronic atrial fibrillation: Secondary | ICD-10-CM | POA: Diagnosis not present

## 2016-08-29 DIAGNOSIS — G8191 Hemiplegia, unspecified affecting right dominant side: Secondary | ICD-10-CM | POA: Diagnosis not present

## 2016-08-29 DIAGNOSIS — Z1389 Encounter for screening for other disorder: Secondary | ICD-10-CM | POA: Diagnosis not present

## 2016-08-29 DIAGNOSIS — Z0000 Encounter for general adult medical examination without abnormal findings: Secondary | ICD-10-CM | POA: Diagnosis not present

## 2016-08-29 DIAGNOSIS — I272 Other secondary pulmonary hypertension: Secondary | ICD-10-CM | POA: Diagnosis not present

## 2016-09-11 DIAGNOSIS — Z7901 Long term (current) use of anticoagulants: Secondary | ICD-10-CM | POA: Diagnosis not present

## 2016-09-29 DIAGNOSIS — D729 Disorder of white blood cells, unspecified: Secondary | ICD-10-CM | POA: Diagnosis not present

## 2016-10-08 DIAGNOSIS — Z7901 Long term (current) use of anticoagulants: Secondary | ICD-10-CM | POA: Diagnosis not present

## 2016-10-13 ENCOUNTER — Telehealth: Payer: Self-pay | Admitting: Oncology

## 2016-10-13 ENCOUNTER — Encounter: Payer: Self-pay | Admitting: Oncology

## 2016-10-13 NOTE — Telephone Encounter (Signed)
Pt's mother confirmed appt, verified demo and insurance, mailed pt letter, faxed referring provider appt date/time.

## 2016-11-05 DIAGNOSIS — Z7901 Long term (current) use of anticoagulants: Secondary | ICD-10-CM | POA: Diagnosis not present

## 2016-11-12 DIAGNOSIS — Z7901 Long term (current) use of anticoagulants: Secondary | ICD-10-CM | POA: Diagnosis not present

## 2016-11-20 DIAGNOSIS — N189 Chronic kidney disease, unspecified: Secondary | ICD-10-CM | POA: Diagnosis not present

## 2016-11-20 DIAGNOSIS — Z9889 Other specified postprocedural states: Secondary | ICD-10-CM | POA: Diagnosis not present

## 2016-11-20 DIAGNOSIS — I272 Pulmonary hypertension, unspecified: Secondary | ICD-10-CM | POA: Diagnosis not present

## 2016-11-20 DIAGNOSIS — Q203 Discordant ventriculoarterial connection: Secondary | ICD-10-CM | POA: Diagnosis not present

## 2016-11-26 ENCOUNTER — Ambulatory Visit (HOSPITAL_BASED_OUTPATIENT_CLINIC_OR_DEPARTMENT_OTHER): Payer: Medicare Other | Admitting: Oncology

## 2016-11-26 ENCOUNTER — Telehealth: Payer: Self-pay | Admitting: Oncology

## 2016-11-26 VITALS — BP 140/69 | HR 77 | Temp 97.4°F | Resp 18 | Ht 68.0 in | Wt 130.0 lb

## 2016-11-26 DIAGNOSIS — D696 Thrombocytopenia, unspecified: Secondary | ICD-10-CM

## 2016-11-26 DIAGNOSIS — D72819 Decreased white blood cell count, unspecified: Secondary | ICD-10-CM | POA: Diagnosis not present

## 2016-11-26 DIAGNOSIS — Z7901 Long term (current) use of anticoagulants: Secondary | ICD-10-CM | POA: Diagnosis not present

## 2016-11-26 NOTE — Telephone Encounter (Signed)
Gave patient/family avs report and appointments for June

## 2016-11-26 NOTE — Progress Notes (Signed)
Reason for Referral: Thrombocytopenia.   HPI: 55 year old gentleman currently of [place] where he lives with his mother. 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 He was referred to me for evaluation of thrombocytopenia after a routine CBC done by his primary care provider which showed a platelet count of 88,000. His white cell count was 3.3 with a hemoglobin of 12.7. His white cell differential was normal. Examining his previous counts for the last 10 years he has had fluctuating thrombocytopenia since 2008. His platelet count in 2008 was low was 84 and recover spontaneously to normal range. His most recent CBC done at [facility] with his cardiologist which showed normal white cell count and hemoglobin with a platelet count of 104,000. He is asymptomatic at this time from this finding. He does not report any bruising or bleeding. He does report occasional epistaxis associated with dry mucous membrane from his oxygen supplements. He denied any hematochezia, melena. He does report occasional bruises related to warfarin. His performance status and activity level is limited because of his other comorbid conditions.  He does not report any headaches, blurry vision, syncope or seizures. He does not report any fevers or chills or sweats. He does not report any cough, wheezing or hemoptysis. Does not report any chest pain, palpitation but does report occasional dyspepsia and exertion. He is not reporting nausea, vomiting or abdominal pain. He does not report any frequency urgency or hesitancy. He does not report any skeletal complaints. Remaining review of systems unremarkable   Past Medical History:  Diagnosis Date  . Arrhythmia   . CHF (congestive heart failure) ([place])   . Coronary artery disease   . GERD (gastroesophageal reflux disease)   . Hypertension   . Stroke ([place])   . Weakness of right arm   . Weakness of  right leg   :  Past Surgical History:  Procedure Laterality Date  . CARDIAC DEFIBRILLATOR PLACEMENT    . CARDIAC DEFIBRILLATOR PLACEMENT    . PACEMAKER INSERTION    :   Current Outpatient Prescriptions:  .  carvedilol (COREG) 6.25 MG tablet, Take 3.125-6.25 mg by mouth 2 (two) times daily with a meal. Takes 1/2 tab in am and 2 tabs in pm, Disp: , Rfl:  .  famotidine (PEPCID) 20 MG tablet, Take 40 mg by mouth at bedtime.  , Disp: , Rfl:  .  lisinopril (PRINIVIL,ZESTRIL) 10 MG tablet, Take 10 mg by mouth daily., Disp: , Rfl:  .  OXYGEN, Inhale 2 L into the lungs continuous., Disp: , Rfl:  .  sotalol (BETAPACE) 80 MG tablet, Take 80 mg by mouth every 12 (twelve) hours.  , Disp: , Rfl:  .  torsemide (DEMADEX) 20 MG tablet, , Disp: , Rfl:  .  warfarin (COUMADIN) 5 MG tablet, Take 2.5-5 mg by mouth at bedtime. 2.5 mg on Sunday, Tuesday, Friday, ; 5 mg on Monday, Wednesday, Thursday, Saturday, Disp: , Rfl: :  Allergies  Allergen Reactions  . Zithromax [Azithromycin] Other (See Comments)    Other Reaction: AFFECTS HR Interacts with heart medicine  :  No family history on file.:  Social History   Social History  . Marital status: Divorced    Spouse name: N/A  . Number of children: N/A  . Years of education: N/A   Occupational History  . Not on file.   Social History Main Topics  . Smoking status: Never Smoker  . Smokeless tobacco:  Not on file  . Alcohol use No  . Drug use: No  . Sexual activity: Not on file   Other Topics Concern  . Not on file   Social History Narrative  . No narrative on file  :  Pertinent items are noted in HPI.  Exam: Blood pressure 140/69, pulse 77, temperature 97.4 F (36.3 C), temperature source Oral, resp. rate 18, height 5\' 8"  (1.727 m), weight 130 lb (59 kg), SpO2 94 %.  ECOG 1.  General appearance: alert and cooperative appeared without distress.  Head: Normocephalic, without obvious abnormality Throat: lips, mucosa, and tongue normal;  teeth and gums normal oral thrush. Neck: no adenopathy Back: negative Resp: clear to auscultation bilaterally Chest wall: no tenderness Cardio: regular rate and rhythm, S1, S2 normal, no murmur, click, rub or gallop GI: soft, non-tender; bowel sounds normal; no masses,  no organomegaly Extremities: extremities normal, atraumatic, no cyanosis or edema  CBC    Component Value Date/Time   WBC 10.0 11/16/2011 1234   RBC 4.06 (L) 11/16/2011 1234   HGB 13.1 11/16/2011 1234   HCT 39.9 11/16/2011 1234   PLT 117 (L) 11/16/2011 1234   MCV 98.3 11/16/2011 1234   MCH 32.3 11/16/2011 1234   MCHC 32.8 11/16/2011 1234   RDW 14.5 11/16/2011 1234   LYMPHSABS 1.0 11/16/2011 1234   MONOABS 0.8 11/16/2011 1234   EOSABS 0.1 11/16/2011 1234   BASOSABS 0.0 11/16/2011 1234     Chemistry      Component Value Date/Time   NA 140 11/16/2011 1234   K 4.5 11/16/2011 1234   CL 103 11/16/2011 1234   CO2 29 11/16/2011 1234   BUN 29 (H) 11/16/2011 1234   CREATININE 1.16 11/16/2011 1234      Component Value Date/Time   CALCIUM 9.1 11/16/2011 1234   ALKPHOS 70 11/16/2011 1234   AST 19 11/16/2011 1234   ALT 10 11/16/2011 1234   BILITOT 0.7 11/16/2011 1234       Assessment and Plan:   55 year old gentleman with the following issues:  1. Thrombocytopenia: It appears to be mild and fluctuating since 2008. His most recent CBC obtained in December 2017 showed a platelet count of 104,000. His platelet count did drift down to 84,000 and 2008 with spontaneous recovery on multiple occasions.  The differential diagnosis was reviewed today with the patient and his family. Immune thrombocytopenia is a possibility as well as reactive thrombocytopenia. Condition such as TTP, HUS, DIC, HIT not considered unlikely. I do not see any evidence to suggest myelodysplasia, leukemia or lymphoma.  Her management standpoint I recommended repeating his CBC 6 months for completeness. I do not see any need for a bone marrow  biopsy at this time but could be a consideration of other abnormalities are noted.  2. Leukocytopenia: Appears to be fluctuating as well and his most recent CBC showed normal white cell count with a normal differential.  3. Follow-up: Will be in 6 months to repeat his CBC.

## 2016-12-04 DIAGNOSIS — N183 Chronic kidney disease, stage 3 (moderate): Secondary | ICD-10-CM | POA: Diagnosis not present

## 2016-12-12 ENCOUNTER — Ambulatory Visit
Admission: RE | Admit: 2016-12-12 | Discharge: 2016-12-12 | Disposition: A | Discharge status: Home / Self Care | Payer: Medicare Other | Source: Ambulatory Visit | Attending: Internal Medicine | Admitting: Internal Medicine

## 2016-12-12 ENCOUNTER — Other Ambulatory Visit: Payer: Self-pay | Admitting: Internal Medicine

## 2016-12-12 DIAGNOSIS — R05 Cough: Secondary | ICD-10-CM

## 2016-12-12 DIAGNOSIS — R059 Cough, unspecified: Secondary | ICD-10-CM

## 2016-12-26 DIAGNOSIS — Z7901 Long term (current) use of anticoagulants: Secondary | ICD-10-CM | POA: Diagnosis not present

## 2017-01-02 DIAGNOSIS — Z9889 Other specified postprocedural states: Secondary | ICD-10-CM | POA: Diagnosis not present

## 2017-01-02 DIAGNOSIS — R230 Cyanosis: Secondary | ICD-10-CM | POA: Diagnosis not present

## 2017-01-02 DIAGNOSIS — I495 Sick sinus syndrome: Secondary | ICD-10-CM | POA: Diagnosis not present

## 2017-01-02 DIAGNOSIS — I484 Atypical atrial flutter: Secondary | ICD-10-CM | POA: Diagnosis not present

## 2017-01-02 DIAGNOSIS — I519 Heart disease, unspecified: Secondary | ICD-10-CM | POA: Diagnosis not present

## 2017-01-02 DIAGNOSIS — Z95810 Presence of automatic (implantable) cardiac defibrillator: Secondary | ICD-10-CM | POA: Diagnosis not present

## 2017-01-07 DIAGNOSIS — Z7901 Long term (current) use of anticoagulants: Secondary | ICD-10-CM | POA: Diagnosis not present

## 2017-01-17 ENCOUNTER — Inpatient Hospital Stay (HOSPITAL_COMMUNITY): Payer: Medicare Other

## 2017-01-17 ENCOUNTER — Emergency Department (HOSPITAL_COMMUNITY): Payer: Medicare Other

## 2017-01-17 ENCOUNTER — Encounter (HOSPITAL_COMMUNITY): Payer: Self-pay | Admitting: Emergency Medicine

## 2017-01-17 ENCOUNTER — Inpatient Hospital Stay (HOSPITAL_COMMUNITY)
Admission: EM | Admit: 2017-01-17 | Discharge: 2017-02-05 | DRG: 871 | Disposition: E | Payer: Medicare Other | Attending: Pulmonary Disease | Admitting: Pulmonary Disease

## 2017-01-17 DIAGNOSIS — Z7901 Long term (current) use of anticoagulants: Secondary | ICD-10-CM

## 2017-01-17 DIAGNOSIS — G9340 Encephalopathy, unspecified: Secondary | ICD-10-CM | POA: Diagnosis not present

## 2017-01-17 DIAGNOSIS — R402113 Coma scale, eyes open, never, at hospital admission: Secondary | ICD-10-CM | POA: Diagnosis not present

## 2017-01-17 DIAGNOSIS — J189 Pneumonia, unspecified organism: Secondary | ICD-10-CM | POA: Diagnosis present

## 2017-01-17 DIAGNOSIS — R4182 Altered mental status, unspecified: Secondary | ICD-10-CM | POA: Diagnosis not present

## 2017-01-17 DIAGNOSIS — Z515 Encounter for palliative care: Secondary | ICD-10-CM | POA: Diagnosis not present

## 2017-01-17 DIAGNOSIS — R402352 Coma scale, best motor response, localizes pain, at arrival to emergency department: Secondary | ICD-10-CM | POA: Diagnosis present

## 2017-01-17 DIAGNOSIS — R579 Shock, unspecified: Secondary | ICD-10-CM | POA: Diagnosis present

## 2017-01-17 DIAGNOSIS — R230 Cyanosis: Secondary | ICD-10-CM | POA: Diagnosis present

## 2017-01-17 DIAGNOSIS — I4892 Unspecified atrial flutter: Secondary | ICD-10-CM | POA: Diagnosis present

## 2017-01-17 DIAGNOSIS — R092 Respiratory arrest: Secondary | ICD-10-CM

## 2017-01-17 DIAGNOSIS — J9600 Acute respiratory failure, unspecified whether with hypoxia or hypercapnia: Secondary | ICD-10-CM | POA: Diagnosis present

## 2017-01-17 DIAGNOSIS — E872 Acidosis: Secondary | ICD-10-CM | POA: Diagnosis present

## 2017-01-17 DIAGNOSIS — N179 Acute kidney failure, unspecified: Secondary | ICD-10-CM | POA: Diagnosis present

## 2017-01-17 DIAGNOSIS — G40409 Other generalized epilepsy and epileptic syndromes, not intractable, without status epilepticus: Secondary | ICD-10-CM | POA: Diagnosis present

## 2017-01-17 DIAGNOSIS — A419 Sepsis, unspecified organism: Principal | ICD-10-CM | POA: Insufficient documentation

## 2017-01-17 DIAGNOSIS — I2510 Atherosclerotic heart disease of native coronary artery without angina pectoris: Secondary | ICD-10-CM | POA: Diagnosis present

## 2017-01-17 DIAGNOSIS — J9601 Acute respiratory failure with hypoxia: Secondary | ICD-10-CM | POA: Diagnosis not present

## 2017-01-17 DIAGNOSIS — I4891 Unspecified atrial fibrillation: Secondary | ICD-10-CM | POA: Diagnosis present

## 2017-01-17 DIAGNOSIS — E875 Hyperkalemia: Secondary | ICD-10-CM | POA: Diagnosis present

## 2017-01-17 DIAGNOSIS — Z9981 Dependence on supplemental oxygen: Secondary | ICD-10-CM | POA: Diagnosis not present

## 2017-01-17 DIAGNOSIS — Z95810 Presence of automatic (implantable) cardiac defibrillator: Secondary | ICD-10-CM | POA: Diagnosis not present

## 2017-01-17 DIAGNOSIS — R402233 Coma scale, best verbal response, inappropriate words, at hospital admission: Secondary | ICD-10-CM | POA: Diagnosis not present

## 2017-01-17 DIAGNOSIS — R6521 Severe sepsis with septic shock: Secondary | ICD-10-CM | POA: Diagnosis not present

## 2017-01-17 DIAGNOSIS — I639 Cerebral infarction, unspecified: Secondary | ICD-10-CM | POA: Diagnosis not present

## 2017-01-17 DIAGNOSIS — G40901 Epilepsy, unspecified, not intractable, with status epilepticus: Secondary | ICD-10-CM | POA: Diagnosis not present

## 2017-01-17 DIAGNOSIS — R569 Unspecified convulsions: Secondary | ICD-10-CM

## 2017-01-17 DIAGNOSIS — I429 Cardiomyopathy, unspecified: Secondary | ICD-10-CM | POA: Diagnosis present

## 2017-01-17 DIAGNOSIS — J9690 Respiratory failure, unspecified, unspecified whether with hypoxia or hypercapnia: Secondary | ICD-10-CM | POA: Diagnosis not present

## 2017-01-17 DIAGNOSIS — Z452 Encounter for adjustment and management of vascular access device: Secondary | ICD-10-CM | POA: Diagnosis not present

## 2017-01-17 DIAGNOSIS — I6501 Occlusion and stenosis of right vertebral artery: Secondary | ICD-10-CM | POA: Diagnosis not present

## 2017-01-17 DIAGNOSIS — Z8774 Personal history of (corrected) congenital malformations of heart and circulatory system: Secondary | ICD-10-CM | POA: Diagnosis not present

## 2017-01-17 DIAGNOSIS — R402142 Coma scale, eyes open, spontaneous, at arrival to emergency department: Secondary | ICD-10-CM | POA: Diagnosis present

## 2017-01-17 DIAGNOSIS — R402222 Coma scale, best verbal response, incomprehensible words, at arrival to emergency department: Secondary | ICD-10-CM | POA: Diagnosis present

## 2017-01-17 DIAGNOSIS — I10 Essential (primary) hypertension: Secondary | ICD-10-CM | POA: Diagnosis not present

## 2017-01-17 DIAGNOSIS — Z66 Do not resuscitate: Secondary | ICD-10-CM | POA: Diagnosis not present

## 2017-01-17 DIAGNOSIS — I69351 Hemiplegia and hemiparesis following cerebral infarction affecting right dominant side: Secondary | ICD-10-CM | POA: Diagnosis not present

## 2017-01-17 DIAGNOSIS — K219 Gastro-esophageal reflux disease without esophagitis: Secondary | ICD-10-CM | POA: Diagnosis present

## 2017-01-17 DIAGNOSIS — I509 Heart failure, unspecified: Secondary | ICD-10-CM | POA: Diagnosis present

## 2017-01-17 DIAGNOSIS — I110 Hypertensive heart disease with heart failure: Secondary | ICD-10-CM | POA: Diagnosis present

## 2017-01-17 DIAGNOSIS — R402333 Coma scale, best motor response, abnormal, at hospital admission: Secondary | ICD-10-CM | POA: Diagnosis not present

## 2017-01-17 DIAGNOSIS — Z4682 Encounter for fitting and adjustment of non-vascular catheter: Secondary | ICD-10-CM | POA: Diagnosis not present

## 2017-01-17 LAB — POCT I-STAT 3, ART BLOOD GAS (G3+)
Acid-base deficit: 11 mmol/L — ABNORMAL HIGH (ref 0.0–2.0)
BICARBONATE: 14.7 mmol/L — AB (ref 20.0–28.0)
O2 Saturation: 99 %
PH ART: 7.296 — AB (ref 7.350–7.450)
PO2 ART: 146 mmHg — AB (ref 83.0–108.0)
TCO2: 16 mmol/L (ref 0–100)
pCO2 arterial: 30 mmHg — ABNORMAL LOW (ref 32.0–48.0)

## 2017-01-17 LAB — COMPREHENSIVE METABOLIC PANEL
ALBUMIN: 2.7 g/dL — AB (ref 3.5–5.0)
ALK PHOS: 79 U/L (ref 38–126)
ALT: 21 U/L (ref 17–63)
ALT: 29 U/L (ref 17–63)
AST: 35 U/L (ref 15–41)
AST: 42 U/L — AB (ref 15–41)
Albumin: 3.3 g/dL — ABNORMAL LOW (ref 3.5–5.0)
Alkaline Phosphatase: 81 U/L (ref 38–126)
Anion gap: 11 (ref 5–15)
Anion gap: 8 (ref 5–15)
BUN: 57 mg/dL — ABNORMAL HIGH (ref 6–20)
BUN: 54 mg/dL — AB (ref 6–20)
CALCIUM: 7.2 mg/dL — AB (ref 8.9–10.3)
CHLORIDE: 102 mmol/L (ref 101–111)
CO2: 23 mmol/L (ref 22–32)
CO2: 17 mmol/L — AB (ref 22–32)
CREATININE: 3.61 mg/dL — AB (ref 0.61–1.24)
Calcium: 8.9 mg/dL (ref 8.9–10.3)
Chloride: 107 mmol/L (ref 101–111)
Creatinine, Ser: 4.03 mg/dL — ABNORMAL HIGH (ref 0.61–1.24)
GFR calc Af Amer: 20 mL/min — ABNORMAL LOW (ref >60)
GFR calc non Af Amer: 18 mL/min — ABNORMAL LOW (ref >60)
GFR, EST AFRICAN AMERICAN: 18 mL/min — AB (ref >60)
GFR, EST NON AFRICAN AMERICAN: 15 mL/min — AB (ref >60)
GLUCOSE: 163 mg/dL — AB (ref 65–99)
Glucose, Bld: 123 mg/dL — ABNORMAL HIGH (ref 65–99)
POTASSIUM: 5.9 mmol/L — AB (ref 3.5–5.1)
Potassium: 6 mmol/L — ABNORMAL HIGH (ref 3.5–5.1)
SODIUM: 136 mmol/L (ref 135–145)
SODIUM: 132 mmol/L — AB (ref 135–145)
Total Bilirubin: 1.1 mg/dL (ref 0.3–1.2)
Total Bilirubin: 1.1 mg/dL (ref 0.3–1.2)
Total Protein: 7.2 g/dL (ref 6.5–8.1)
Total Protein: 6.1 g/dL — ABNORMAL LOW (ref 6.5–8.1)

## 2017-01-17 LAB — PROTIME-INR
INR: 2
INR: 2.26
PROTHROMBIN TIME: 25.3 s — AB (ref 11.4–15.2)
Prothrombin Time: 23 seconds — ABNORMAL HIGH (ref 11.4–15.2)

## 2017-01-17 LAB — CBC
HCT: 38.7 % — ABNORMAL LOW (ref 39.0–52.0)
Hemoglobin: 12.5 g/dL — ABNORMAL LOW (ref 13.0–17.0)
MCH: 32.3 pg (ref 26.0–34.0)
MCHC: 32.3 g/dL (ref 30.0–36.0)
MCV: 100 fL (ref 78.0–100.0)
PLATELETS: 112 10*3/uL — AB (ref 150–400)
RBC: 3.87 MIL/uL — ABNORMAL LOW (ref 4.22–5.81)
RDW: 14.5 % (ref 11.5–15.5)
WBC: 4.3 10*3/uL (ref 4.0–10.5)

## 2017-01-17 LAB — PHOSPHORUS: Phosphorus: 4 mg/dL (ref 2.5–4.6)

## 2017-01-17 LAB — I-STAT CG4 LACTIC ACID, ED
Lactic Acid, Venous: 2.38 mmol/L (ref 0.5–1.9)
Lactic Acid, Venous: 2.16 mmol/L (ref 0.5–1.9)

## 2017-01-17 LAB — I-STAT ARTERIAL BLOOD GAS, ED
ACID-BASE DEFICIT: 7 mmol/L — AB (ref 0.0–2.0)
Bicarbonate: 19.3 mmol/L — ABNORMAL LOW (ref 20.0–28.0)
O2 SAT: 99 %
PCO2 ART: 42.1 mmHg (ref 32.0–48.0)
PH ART: 7.269 — AB (ref 7.350–7.450)
Patient temperature: 98.6
TCO2: 21 mmol/L (ref 0–100)
pO2, Arterial: 135 mmHg — ABNORMAL HIGH (ref 83.0–108.0)

## 2017-01-17 LAB — CBC WITH DIFFERENTIAL/PLATELET
BASOS PCT: 0 %
Basophils Absolute: 0 10*3/uL (ref 0.0–0.1)
EOS ABS: 0 10*3/uL (ref 0.0–0.7)
Eosinophils Relative: 0 %
HEMATOCRIT: 38 % — AB (ref 39.0–52.0)
Hemoglobin: 12.2 g/dL — ABNORMAL LOW (ref 13.0–17.0)
LYMPHS ABS: 0.7 10*3/uL (ref 0.7–4.0)
Lymphocytes Relative: 11 %
MCH: 32.5 pg (ref 26.0–34.0)
MCHC: 32.1 g/dL (ref 30.0–36.0)
MCV: 101.3 fL — ABNORMAL HIGH (ref 78.0–100.0)
MONO ABS: 0.6 10*3/uL (ref 0.1–1.0)
MONOS PCT: 9 %
Neutro Abs: 5.3 10*3/uL (ref 1.7–7.7)
Neutrophils Relative %: 80 %
Platelets: 101 10*3/uL — ABNORMAL LOW (ref 150–400)
RBC: 3.75 MIL/uL — ABNORMAL LOW (ref 4.22–5.81)
RDW: 14.6 % (ref 11.5–15.5)
WBC: 6.6 10*3/uL (ref 4.0–10.5)

## 2017-01-17 LAB — RAPID URINE DRUG SCREEN, HOSP PERFORMED
AMPHETAMINES: NOT DETECTED
BARBITURATES: NOT DETECTED
BENZODIAZEPINES: POSITIVE — AB
COCAINE: NOT DETECTED
Opiates: NOT DETECTED
TETRAHYDROCANNABINOL: NOT DETECTED

## 2017-01-17 LAB — URINALYSIS, ROUTINE W REFLEX MICROSCOPIC
Bilirubin Urine: NEGATIVE
Glucose, UA: 50 mg/dL — AB
Hgb urine dipstick: NEGATIVE
Ketones, ur: NEGATIVE mg/dL
Leukocytes, UA: NEGATIVE
NITRITE: NEGATIVE
PH: 5 (ref 5.0–8.0)
Protein, ur: 100 mg/dL — AB
SPECIFIC GRAVITY, URINE: 1.014 (ref 1.005–1.030)

## 2017-01-17 LAB — LACTIC ACID, PLASMA
LACTIC ACID, VENOUS: 1.8 mmol/L (ref 0.5–1.9)
Lactic Acid, Venous: 2 mmol/L (ref 0.5–1.9)

## 2017-01-17 LAB — MAGNESIUM: Magnesium: 2.1 mg/dL (ref 1.7–2.4)

## 2017-01-17 LAB — PROCALCITONIN: Procalcitonin: 0.14 ng/mL

## 2017-01-17 LAB — BRAIN NATRIURETIC PEPTIDE: B NATRIURETIC PEPTIDE 5: 389.5 pg/mL — AB (ref 0.0–100.0)

## 2017-01-17 LAB — I-STAT TROPONIN, ED: TROPONIN I, POC: 0.03 ng/mL (ref 0.00–0.08)

## 2017-01-17 LAB — LIPASE, BLOOD: Lipase: 20 U/L (ref 11–51)

## 2017-01-17 LAB — CORTISOL: Cortisol, Plasma: 25.5 ug/dL

## 2017-01-17 LAB — ETHANOL

## 2017-01-17 MED ORDER — NALOXONE HCL 2 MG/2ML IJ SOSY
PREFILLED_SYRINGE | INTRAMUSCULAR | Status: AC
  Administered 2017-01-17: 14:00:00
  Filled 2017-01-17: qty 2

## 2017-01-17 MED ORDER — VANCOMYCIN HCL 500 MG IV SOLR
500.0000 mg | INTRAVENOUS | Status: DC
  Administered 2017-01-17: 500 mg via INTRAVENOUS
  Filled 2017-01-17 (×2): qty 500

## 2017-01-17 MED ORDER — LORAZEPAM 2 MG/ML IJ SOLN
INTRAMUSCULAR | Status: AC
  Administered 2017-01-17: 6 mg
  Filled 2017-01-17: qty 3

## 2017-01-17 MED ORDER — IOPAMIDOL (ISOVUE-370) INJECTION 76%
INTRAVENOUS | Status: AC
  Administered 2017-01-17: 50 mL
  Filled 2017-01-17: qty 50

## 2017-01-17 MED ORDER — DEXTROSE 5 % IV SOLN
2.0000 g | Freq: Two times a day (BID) | INTRAVENOUS | Status: DC
  Administered 2017-01-18 (×2): 2 g via INTRAVENOUS
  Filled 2017-01-17 (×3): qty 2

## 2017-01-17 MED ORDER — SODIUM CHLORIDE 0.9 % IV BOLUS (SEPSIS)
1000.0000 mL | Freq: Once | INTRAVENOUS | Status: AC
  Administered 2017-01-17: 1000 mL via INTRAVENOUS

## 2017-01-17 MED ORDER — VANCOMYCIN HCL IN DEXTROSE 750-5 MG/150ML-% IV SOLN: 750.0000 mg | Freq: Two times a day (BID) | INTRAVENOUS | Status: DC

## 2017-01-17 MED ORDER — PIPERACILLIN-TAZOBACTAM 3.375 G IVPB: 3.3750 g | Freq: Three times a day (TID) | INTRAVENOUS | Status: DC

## 2017-01-17 MED ORDER — VASOPRESSIN 20 UNIT/ML IV SOLN
0.0300 [IU]/min | INTRAVENOUS | Status: DC
  Administered 2017-01-17 – 2017-01-18 (×2): 0.03 [IU]/min via INTRAVENOUS
  Filled 2017-01-17 (×2): qty 2

## 2017-01-17 MED ORDER — SODIUM CHLORIDE 0.9 % IV SOLN
2.0000 g | Freq: Three times a day (TID) | INTRAVENOUS | Status: DC
  Administered 2017-01-17 – 2017-01-18 (×3): 2 g via INTRAVENOUS
  Filled 2017-01-17 (×6): qty 2000

## 2017-01-17 MED ORDER — SUCCINYLCHOLINE CHLORIDE 20 MG/ML IJ SOLN
INTRAMUSCULAR | Status: AC | PRN
  Administered 2017-01-17: 100 mg via INTRAVENOUS

## 2017-01-17 MED ORDER — SODIUM CHLORIDE 0.9 % IV SOLN: INTRAVENOUS | Status: DC

## 2017-01-17 MED ORDER — VANCOMYCIN HCL IN DEXTROSE 1-5 GM/200ML-% IV SOLN
1000.0000 mg | Freq: Once | INTRAVENOUS | Status: AC
  Administered 2017-01-17: 1000 mg via INTRAVENOUS
  Filled 2017-01-17: qty 200

## 2017-01-17 MED ORDER — SODIUM CHLORIDE 0.9 % IV SOLN
1000.0000 mg | Freq: Once | INTRAVENOUS | Status: AC
  Administered 2017-01-17: 1000 mg via INTRAVENOUS
  Filled 2017-01-17: qty 10

## 2017-01-17 MED ORDER — ETOMIDATE 2 MG/ML IV SOLN
INTRAVENOUS | Status: AC | PRN
  Administered 2017-01-17: 20 mg via INTRAVENOUS

## 2017-01-17 MED ORDER — MIDAZOLAM HCL 2 MG/2ML IJ SOLN
2.0000 mg | Freq: Once | INTRAMUSCULAR | Status: AC
  Administered 2017-01-17: 2 mg via INTRAVENOUS

## 2017-01-17 MED ORDER — ASPIRIN 300 MG RE SUPP: 300.0000 mg | RECTAL | Status: AC

## 2017-01-17 MED ORDER — SODIUM CHLORIDE 0.9 % IV SOLN
1.0000 mg/h | INTRAVENOUS | Status: DC
  Administered 2017-01-17: 0.5 mg/h via INTRAVENOUS
  Filled 2017-01-17: qty 10

## 2017-01-17 MED ORDER — MIDAZOLAM HCL 2 MG/2ML IJ SOLN
INTRAMUSCULAR | Status: AC
  Filled 2017-01-17: qty 2

## 2017-01-17 MED ORDER — FENTANYL CITRATE (PF) 100 MCG/2ML IJ SOLN
25.0000 ug | Freq: Once | INTRAMUSCULAR | Status: AC
  Administered 2017-01-17: 25 ug via INTRAVENOUS

## 2017-01-17 MED ORDER — NOREPINEPHRINE BITARTRATE 1 MG/ML IV SOLN
0.0000 ug/min | INTRAVENOUS | Status: DC
  Administered 2017-01-17: 5 ug/min via INTRAVENOUS
  Administered 2017-01-18: 40 ug/min via INTRAVENOUS
  Filled 2017-01-17 (×4): qty 4

## 2017-01-17 MED ORDER — CHLORHEXIDINE GLUCONATE 0.12% ORAL RINSE (MEDLINE KIT)
15.0000 mL | Freq: Two times a day (BID) | OROMUCOSAL | Status: DC
  Administered 2017-01-18 (×2): 15 mL via OROMUCOSAL

## 2017-01-17 MED ORDER — ORAL CARE MOUTH RINSE
15.0000 mL | Freq: Four times a day (QID) | OROMUCOSAL | Status: DC
  Administered 2017-01-18 (×3): 15 mL via OROMUCOSAL

## 2017-01-17 MED ORDER — SODIUM CHLORIDE 0.9 % IV SOLN
25.0000 ug/h | INTRAVENOUS | Status: DC
  Administered 2017-01-17: 50 ug/h via INTRAVENOUS
  Filled 2017-01-17: qty 50

## 2017-01-17 MED ORDER — SODIUM CHLORIDE 0.9 % IV SOLN: 250.0000 mL | INTRAVENOUS | Status: DC | PRN

## 2017-01-17 MED ORDER — HEPARIN (PORCINE) IN NACL 100-0.45 UNIT/ML-% IJ SOLN
950.0000 [IU]/h | INTRAMUSCULAR | Status: DC
  Administered 2017-01-17: 950 [IU]/h via INTRAVENOUS
  Filled 2017-01-17 (×2): qty 250

## 2017-01-17 MED ORDER — DEXTROSE 5 % IV SOLN: 2.0000 g | INTRAVENOUS | Status: DC

## 2017-01-17 MED ORDER — FENTANYL CITRATE (PF) 100 MCG/2ML IJ SOLN
INTRAMUSCULAR | Status: AC
  Filled 2017-01-17: qty 2

## 2017-01-17 MED ORDER — PROPOFOL 1000 MG/100ML IV EMUL: 5.0000 ug/kg/min | INTRAVENOUS | Status: DC

## 2017-01-17 MED ORDER — PIPERACILLIN-TAZOBACTAM 3.375 G IVPB 30 MIN: 3.3750 g | Freq: Once | INTRAVENOUS | Status: DC

## 2017-01-17 MED ORDER — PANTOPRAZOLE SODIUM 40 MG IV SOLR
40.0000 mg | Freq: Every day | INTRAVENOUS | Status: DC
  Administered 2017-01-17: 40 mg via INTRAVENOUS
  Filled 2017-01-17 (×2): qty 40

## 2017-01-17 MED ORDER — ASPIRIN 81 MG PO CHEW: 324.0000 mg | CHEWABLE_TABLET | ORAL | Status: AC

## 2017-01-17 NOTE — Procedures (Signed)
Arterial Catheter Insertion Procedure Note Jacob Nunez [id number] 1960-12-24  Procedure: Insertion of Arterial Catheter  Indications: Blood pressure monitoring and Frequent blood sampling  Procedure Details Consent: Risks of procedure as well as the alternatives and risks of each were explained to the (patient/caregiver).  Consent for procedure obtained. Time Out: Verified patient identification, verified procedure, site/side was marked, verified correct patient position, special equipment/implants available, medications/allergies/relevent history reviewed, required imaging and test results available.  Performed  Maximum sterile technique was used including antiseptics, cap, gloves, gown, hand hygiene, mask and sheet. Skin prep: Chlorhexidine; local anesthetic administered 20 gauge catheter was inserted into right radial artery using the Seldinger technique.  Evaluation Blood flow good; BP tracing good. Complications: No apparent complications.   Jacob Nunez 01/25/2017

## 2017-01-17 NOTE — Progress Notes (Signed)
Patient transported to CT and trauma C without complications.

## 2017-01-17 NOTE — H&P (Signed)
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 Transported to ED and was noted to be stiffene  and was intubated. PCCM called to bedside for further evaluation. Unknown cause of szs and neuro was consulted. He is a LCB intubation and pressors. E will treat with abx and further workups.  PAST MEDICAL HISTORY :  He  has a past medical history of Arrhythmia; CHF (congestive heart failure) ([place]); Coronary artery disease; GERD (gastroesophageal reflux disease); Hypertension; Stroke [facility]); Weakness of right arm; and Weakness of right leg.  PAST SURGICAL HISTORY: He  has a past surgical history that includes Pacemaker insertion; Cardiac defibrillator placement; and Cardiac defibrillator placement.  Allergies  Allergen Reactions  . Zithromax [Azithromycin] Other (See Comments)    Other Reaction: AFFECTS HR Interacts with heart medicine    No current facility-administered medications on file prior to encounter.    Current Outpatient Prescriptions on File Prior to Encounter  Medication Sig  . carvedilol (COREG) 6.25 MG tablet Take 6.25-12.5 mg by mouth See admin instructions. Take 1 tablet (6.25 mg) by mouth every morning and 2 tablets (12.5 mg) at bedtime  . famotidine (PEPCID) 20 MG tablet Take 20 mg by mouth 2 (two) times daily.   Marland Kitchen lisinopril (PRINIVIL,ZESTRIL) 10 MG tablet Take 10 mg by mouth daily.  . OXYGEN Inhale 2 L into the lungs continuous.  . sotalol (BETAPACE) 80 MG tablet Take 80 mg by mouth 2 (two) times daily.   Marland Kitchen torsemide (DEMADEX) 20 MG tablet Take 20 mg by mouth daily.   Marland Kitchen warfarin (COUMADIN) 5 MG tablet Take 2.5-5 mg by mouth See admin instructions. Take 1 tablet (5 mg) by mouth Monday,  Wednesday, Friday at bedtime, take 1/2 tablet (2.5 mg) on Sunday, Tuesday, Thursday, Saturday - at bedtime    FAMILY HISTORY:  His has no family status information on file.    SOCIAL HISTORY: He  reports that he has never smoked. He does not have any smokeless tobacco history on file. He reports that he does not drink alcohol or use drugs.  REVIEW OF SYSTEMS:   na  SUBJECTIVE:  Critically ill  VITAL SIGNS: There were no vitals taken for this visit.  HEMODYNAMICS:    VENTILATOR SETTINGS:    INTAKE / OUTPUT: No intake/output data recorded.  PHYSICAL EXAMINATION: General: Thin wm blue from clavicle up Neuro:  Rigid , old contractures rt arm HEENT:  ET/OGT Cardiovascular: HSD Lungs:  Coarse rhonchi bilat Abdomen:  rigid Musculoskeletal:  rigid Skin:  warm  LABS:  BMET  Recent Labs Lab 01/30/2017 1400  NA 136  K 5.9*  CL 102  CO2 23  BUN 57*  CREATININE 4.03*  GLUCOSE 123*    Electrolytes  Recent Labs Lab 01/15/2017 1400  CALCIUM 8.9    CBC  Recent Labs Lab 02/01/2017 1400  WBC 4.3  HGB 12.5*  HCT 38.7*  PLT 112*    Coag's  Recent Labs Lab 01/12/2017 1400  INR 2.00    Sepsis Markers  Recent Labs Lab 01/17/17 1415  LATICACIDVEN 2.38*    ABG No results for input(s): PHART, PCO2ART, PO2ART in the last 168 hours.  Liver Enzymes  Recent Labs Lab  01/30/2017 1400  AST 35  ALT 21  ALKPHOS 81  BILITOT 1.1  ALBUMIN 3.3*    Cardiac Enzymes No results for input(s): TROPONINI, PROBNP in the last 168 hours.  Glucose No results for input(s): GLUCAP in the last 168 hours.  Imaging Ct Head Wo Contrast  Result Date: 01/28/2017 CLINICAL DATA:  Altered mental status, possible seizure. History of old CVA EXAM: CT HEAD WITHOUT CONTRAST TECHNIQUE: Contiguous axial images were obtained from the base of the skull through the vertex without intravenous contrast. COMPARISON:  06/09/2016 and previous FINDINGS: Brain: Bilateral frontal infarcts  left greater than right, stable. Lacunar infarct in the left basal ganglia, stable. There is no evidence of acute intracranial hemorrhage, brain edema, mass lesion, acute infarction, mass effect, or midline shift. Acute infarct may be inapparent on noncontrast CT. No other intra-axial abnormalities are seen. No hydrocephalus. No abnormal extra-axial fluid collections or masses are identified. Vascular: No hyperdense vessel or unexpected calcification.Dx Skull:  No significant calvarial abnormality. Sinuses/Orbits: Mucoperiosteal thickening in the right maxillary sinus. Remainder of paranasal sinuses and mastoid air cells appear normally developed and well aerated. Orbits unremarkable. Other: None. IMPRESSION: 1. Negative for bleed or other acute intracranial process. 2. Stable old bilateral frontal infarcts left greater than right. Electronically Signed   By: Lucrezia Europe M.D.   On: 01/19/2017 14:53   Dg Chest Portable 1 View  Result Date: 02/02/2017 CLINICAL DATA:  Status post intubation EXAM: PORTABLE CHEST 1 VIEW COMPARISON:  Film from earlier in the same day FINDINGS: Cardiac shadow is stable. Defibrillator is again noted in stable position. Endotracheal tube is now seen 5.4 cm above the carina in satisfactory position. A nasogastric catheter is noted within the stomach. Persistent bilateral infiltrates are again seen. IMPRESSION: Endotracheal tube and nasogastric catheter in satisfactory position. Stable bilateral infiltrates Electronically Signed   By: Inez Catalina M.D.   On: 01/30/2017 14:34   Dg Chest Port 1 View  Result Date: 01/23/2017 CLINICAL DATA:  Pt had a seizure this morning and was c/o nausea prior to the seizure. Pt has AMS. EXAM: PORTABLE CHEST - 1 VIEW COMPARISON:  12/12/2016 FINDINGS: Mild cardiomegaly . Bilateral subclavian Venous AICD leads extend through intracardiac stent into left coronary sinus, left atrium and left ventricle as before in this patient with a history of transposition  of great vessels. Stable epicardial lead. Heart size and mediastinal contours are within normal limits. Widening of the superior mediastinum is emphasized by portable technique. Worsening perihilar airspace opacities bilaterally. Right pleural effusion appears slightly larger than on previous exam. No pneumothorax. IMPRESSION: 1. Worsening bilateral infiltrates or edema. 2. Suspect slight increase in right pleural effusion. Electronically Signed   By: Lucrezia Europe M.D.   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 Transported to ED and was noted to be stiffene  and was intubated. PCCM called to bedside for further evaluation. Unknown cause of szs and neuro was consulted. He is a LCB intubation and pressors. E will treat with abx and further workups.  ASSESSMENT / PLAN:  PULMONARY A: VDRF secondary to inability to protect airway P:   Vent bundle  CARDIOVASCULAR A:  Shock from unknown origin Hx  of great vessel transpostion and repair as a child AICD in place P:  Pressor support  RENAL Lab Results  Component Value Date   CREATININE 4.03 (H) 01/28/2017   CREATININE 1.16 11/16/2011   CREATININE 0.90 10/04/2011    Recent Labs Lab 01/11/2017 1400  K 5.9*     A:   Acute renal failure Hyperkalemia. Suspect from sz activity Lactic acidosis. Suspect from sz activity P:   Hydration  Monitor lytes  GASTROINTESTINAL A:   GI protection OGT P:   PPI OGT ->LWIS  HEMATOLOGIC A:   Chronic coumadin with extensive heart surgery, hx stroke P:  INr 2.0 Hold off on anticoagulation for now  INFECTIOUS A:   No overt s/s of infection P:   Monitor temp and wbc  ENDOCRINE A:   No  acute issues   P:   Follow glucose on labs  NEUROLOGIC A:   Hx of strokes New onset of syncopal episode 2/10 2/10 new Szs and rigidity  P:   RASS goal: 0 Ativan to break szs and rigidity   Neuro consult 2/10   FAMILY  - Updates: Family up dated at bedside per Dr. Nelda Marseille  - Inter-disciplinary family meet or Palliative Care meeting due by:  day 7  App cct 45 min  Richardson Landry Minor ACNP Maryanna Shape PCCM Pager [phone] till 3 pm If no answer page [phone] 01/27/2017, 3:17 PM  Attending Note:  56 year old male with extensive cardiac history and multiple CVAs who presents after a seizure and a fall.  In the ER patient is very rigid but per EDP prior to seizure his neck was supple and he was responding to his name.  Lungs are clear bilaterally.  I reviewed, bilateral infiltrate noted, ETT 6 cm above carina.  Head CT with two anterior circulation stroke.  At this point, it is unclear what is wrong with the patient.  I would estimate that he is having a seizure but meningitis is a possibility as well.  Unable to get an LP done due to coagulopathy.  Will treat as meninginitis.  Neurology called, propofol for suppression.  CT of the head with contrast for ?occlusion.  Renal failure noted, will hydrate.  Hypotension likely due to benzos but can not r/o septic shock.  Will place TLC and levophed.  Will admit to ICU and adjust vent.  The patient is critically ill with multiple organ systems failure and requires high complexity decision making for assessment and support, frequent evaluation and titration of therapies, application of advanced monitoring technologies and extensive interpretation of multiple databases.   Critical Care Time devoted to patient care services described in this note is  60  Minutes. This time reflects time of care of this signee Dr [name]. This critical care time does not reflect procedure time, or teaching time or supervisory time of PA/NP/Med student/Med Resident etc but  could involve care discussion time.  Rush Farmer, M.D. [facility] Pulmonary/Critical Care Medicine. Pager: [phone]. After hours pager: [phone].

## 2017-01-17 NOTE — Progress Notes (Addendum)
S/O: Called to re-evaluate patient and discuss with family.   BP (!) 73/60   Pulse 60   Temp 97.7 F (36.5 C) (Oral)   Resp (!) 28   Ht 5\' 5"  (1.651 m)   Wt 59 kg (130 lb 1.1 oz)   SpO2 90%   BMI 21.64 kg/m    Ment: On minimal sedation. GCS = 5 (E1 V1t M3) CN: Pupils 1.5 mm and unreactive. Weak corneal on right, no corneal on left. No doll's eye reflex. Intubated. Motor/Sensory: Spastic increased tone bilateral lower extremities with weak withdrawal to noxious. No movement of upper extremities to noxious.  Reflexes: Hypoactive throughout.   A/R: 1. Comatose following syncopal event with recovery. Possible second syncopal event triggering prolonged seizure versus brainstem stroke. Was not a tPA candidate due to anticoagulation. No treatable LVO on CTA.  2. 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 Question of arrhythmia as possible precipitating event. Also with persistent low BP and could this be related to cardiac output or shock?  3. MRI is indicated to rule out brainstem infarction. Unclear if his implanted cardiac defibrillator is MRI compatible or not. He has a Tax inspector (Serial # [id number], Model # [id number]) - the contact number on his [employer] ID card is Armanda Heritage, MD at [facility] Pediatric Cardiology [phone].  4. cEEG so far shows no electrographic seizures. Therefore ongoing status epilepticus has been ruled out, although prolonged seizures with postictal state is still on the DDx.  5. Sepsis also on DDx given hypotension and elevated lactic acid. However, lactate elevation and acidosis seen on ABG also as likely to be secondary to seizure activity.  6. Continue empiric antibiotics.  7. On heparin infusion. Coumadin has been held.    Discussed with family at the bedside.   Electronically signed: Dr. Kerney Elbe

## 2017-01-17 NOTE — Progress Notes (Addendum)
Pharmacy Antibiotic Note  HJALMER DANOWSKI is a 56 y.o. male admitted on 01/26/2017 with sepsis.  Pharmacy has been consulted for vancomycin and zosyn dosing. Wt 59 kg 11/26/16. Lactate 2.38, HR 50, RR 18. Creat WNL on recent stay. Creat elevated at 4.03, WBC low at 4.3. Normalized creat cl 21 ml/min.  Plan: vancomycin 1 gm IV x 1 and zosyn 3.375 gm IV over 30 minutes for first dose Vancomycin 500 mg IV q24 for vancomycin trough goal 15-20 mcg/ml  Zosyn 3.375 gm IV q8h, infuse each dose over 4 hours F/u renal fxn, wbc, temp, culture data vanc levels as needed   No data recorded.   Recent Labs Lab 01/29/2017 1415  LATICACIDVEN 2.38*    CrCl cannot be calculated (Patient's most recent lab result is older than the maximum 21 days allowed.).    Allergies  Allergen Reactions  . Zithromax [Azithromycin] Other (See Comments)    Other Reaction: AFFECTS HR Interacts with heart medicine    Eudelia Bunch, Pharm.D. [phone] 01/15/2017 2:25 PM

## 2017-01-17 NOTE — ED Provider Notes (Addendum)
[facility] DEPT Provider Note   CSN: [id number] Arrival date & time: 02/03/2017  1315     History   Chief Complaint Chief Complaint  Patient presents with  . Seizures    HPI TIMUR ILLINGWORTH is a 56 y.o. male.  HPI  A LEVEL 5 CAVEAT PERTAINS DUE TO ALTERED MENTAL STATUS.  Pt presenting via EMS.  Pt has hx of congenital heart disease, pacemaker, CHF.  Per family he had a fall- possible syncopal episode- in ambulance he had a generalized tonic seizure- was given versed and has been unresponsive since that time.  Does not answer to name.  Continues to breath on his own.    Past Medical History:  Diagnosis Date  . Arrhythmia   . CHF (congestive heart failure) ([place])   . Coronary artery disease   . GERD (gastroesophageal reflux disease)   . Hypertension   . Stroke ([place])   . Weakness of right arm   . Weakness of right leg     Patient Active Problem List   Diagnosis Date Noted  . Arrhythmia   . GERD (gastroesophageal reflux disease)   . Weakness of right leg   . Weakness of right arm     Past Surgical History:  Procedure Laterality Date  . CARDIAC DEFIBRILLATOR PLACEMENT    . CARDIAC DEFIBRILLATOR PLACEMENT    . PACEMAKER INSERTION         Home Medications    Prior to Admission medications   Medication Sig Start Date End Date Taking? Authorizing Provider  carvedilol (COREG) 6.25 MG tablet Take 3.125-6.25 mg by mouth 2 (two) times daily with a meal. Takes 1/2 tab in am and 2 tabs in pm    Historical Provider, MD  famotidine (PEPCID) 20 MG tablet Take 40 mg by mouth at bedtime.      Historical Provider, MD  lisinopril (PRINIVIL,ZESTRIL) 10 MG tablet Take 10 mg by mouth daily.    Historical Provider, MD  OXYGEN Inhale 2 L into the lungs continuous.    Historical Provider, MD  sotalol (BETAPACE) 80 MG tablet Take 80 mg by mouth every 12 (twelve) hours.      Historical Provider, MD  torsemide (DEMADEX) 20 MG tablet  11/10/16   Historical Provider, MD  warfarin (COUMADIN)  5 MG tablet Take 2.5-5 mg by mouth at bedtime. 2.5 mg on Sunday, Tuesday, Friday, ; 5 mg on Monday, Wednesday, Thursday, Saturday    Historical Provider, MD    Family History History reviewed. No pertinent family history.  Social History Social History  Substance Use Topics  . Smoking status: Never Smoker  . Smokeless tobacco: Not on file  . Alcohol use No     Allergies   Zithromax [azithromycin]   Review of Systems Review of Systems  UNABLE TO OBTAIN ROS DUE TO LEVEL 5 CAVEATS   Physical Exam Updated Vital Signs There were no vitals taken for this visit.  Physical Exam  Physical Examination: General appearance - unresponsive, chronically ill appearing Mental status -unresponsive Eyes - Pupils approx 1-2MM nonreactive Mouth - mucous membranes moist, pharynx normal without lesions Neck - supple, no significant adenopathy Chest - clear to auscultation, no wheezes, rales or rhonchi, symmetric air entry Heart - normal rate, regular rhythm, normal S1, S2, no murmurs, rubs, clicks or gallops, pacer present in chest wall Abdomen - soft, nontender, nondistended, no masses or organomegaly Neurological - unresponsive- briefly coughed and responded to name- said "I feel sick", then unresponsive again Extremities - peripheral  pulses normal, no pedal edema, no clubbing or cyanosis Skin - mottled skin color, poor skin turgor   ED Treatments / Results  Labs (all labs ordered are listed, but only abnormal results are displayed) Labs Reviewed  CBC - Abnormal; Notable for the following:       Result Value   RBC 3.87 (*)    Hemoglobin 12.5 (*)    HCT 38.7 (*)    All other components within normal limits  COMPREHENSIVE METABOLIC PANEL - Abnormal; Notable for the following:    Potassium 5.9 (*)    Glucose, Bld 123 (*)    BUN 57 (*)    Creatinine, Ser 4.03 (*)    Albumin 3.3 (*)    GFR calc non Af Amer 15 (*)    GFR calc Af Amer 18 (*)    All other components within normal  limits  PROTIME-INR - Abnormal; Notable for the following:    Prothrombin Time 23.0 (*)    All other components within normal limits  I-STAT CG4 LACTIC ACID, ED - Abnormal; Notable for the following:    Lactic Acid, Venous 2.38 (*)    All other components within normal limits  CULTURE, BLOOD (ROUTINE X 2)  CULTURE, BLOOD (ROUTINE X 2)  URINE CULTURE  ETHANOL  URINALYSIS, ROUTINE W REFLEX MICROSCOPIC  RAPID URINE DRUG SCREEN, HOSP PERFORMED  I-STAT TROPOININ, ED  I-STAT CG4 LACTIC ACID, ED    EKG  EKG Interpretation  Date/Time:  Saturday January 17 2017 13:19:19 EST Ventricular Rate:  70 PR Interval:    QRS Duration: 168 QT Interval:  470 QTC Calculation: 508 R Axis:   113 Text Interpretation:  Sinus or ectopic atrial rhythm Ventricular premature complex Prolonged PR interval Right bundle branch block Borderline ST depression, lateral leads No significant change since last tracing Confirmed by Canary Brim  MD, Ed Mandich [phone]) on 01/10/2017 2:23:33 PM       Radiology Ct Head Wo Contrast  Result Date: 01/09/2017 CLINICAL DATA:  Altered mental status, possible seizure. History of old CVA EXAM: CT HEAD WITHOUT CONTRAST TECHNIQUE: Contiguous axial images were obtained from the base of the skull through the vertex without intravenous contrast. COMPARISON:  06/09/2016 and previous FINDINGS: Brain: Bilateral frontal infarcts left greater than right, stable. Lacunar infarct in the left basal ganglia, stable. There is no evidence of acute intracranial hemorrhage, brain edema, mass lesion, acute infarction, mass effect, or midline shift. Acute infarct may be inapparent on noncontrast CT. No other intra-axial abnormalities are seen. No hydrocephalus. No abnormal extra-axial fluid collections or masses are identified. Vascular: No hyperdense vessel or unexpected calcification.Dx Skull:  No significant calvarial abnormality. Sinuses/Orbits: Mucoperiosteal thickening in the right maxillary sinus.  Remainder of paranasal sinuses and mastoid air cells appear normally developed and well aerated. Orbits unremarkable. Other: None. IMPRESSION: 1. Negative for bleed or other acute intracranial process. 2. Stable old bilateral frontal infarcts left greater than right. Electronically Signed   By: Lucrezia Europe M.D.   On: 01/15/2017 14:53   Dg Chest Portable 1 View  Result Date: 01/31/2017 CLINICAL DATA:  Status post intubation EXAM: PORTABLE CHEST 1 VIEW COMPARISON:  Film from earlier in the same day FINDINGS: Cardiac shadow is stable. Defibrillator is again noted in stable position. Endotracheal tube is now seen 5.4 cm above the carina in satisfactory position. A nasogastric catheter is noted within the stomach. Persistent bilateral infiltrates are again seen. IMPRESSION: Endotracheal tube and nasogastric catheter in satisfactory position. Stable bilateral infiltrates Electronically Signed  By: Inez Catalina M.D.   On: 01/14/2017 14:34   Dg Chest Port 1 View  Result Date: 01/29/2017 CLINICAL DATA:  Pt had a seizure this morning and was c/o nausea prior to the seizure. Pt has AMS. EXAM: PORTABLE CHEST - 1 VIEW COMPARISON:  12/12/2016 FINDINGS: Mild cardiomegaly . Bilateral subclavian Venous AICD leads extend through intracardiac stent into left coronary sinus, left atrium and left ventricle as before in this patient with a history of transposition of great vessels. Stable epicardial lead. Heart size and mediastinal contours are within normal limits. Widening of the superior mediastinum is emphasized by portable technique. Worsening perihilar airspace opacities bilaterally. Right pleural effusion appears slightly larger than on previous exam. No pneumothorax. IMPRESSION: 1. Worsening bilateral infiltrates or edema. 2. Suspect slight increase in right pleural effusion. Electronically Signed   By: Lucrezia Europe M.D.   On: 01/09/2017 13:59    Procedures Procedures (including critical care time)  Medications  Ordered in ED Medications  naloxone (NARCAN) 2 MG/2ML injection (not administered)  sodium chloride 0.9 % bolus 1,000 mL (not administered)  sodium chloride 0.9 % bolus 1,000 mL (not administered)    And  sodium chloride 0.9 % bolus 1,000 mL (not administered)  piperacillin-tazobactam (ZOSYN) IVPB 3.375 g (not administered)  vancomycin (VANCOCIN) IVPB 1000 mg/200 mL premix (not administered)  piperacillin-tazobactam (ZOSYN) IVPB 3.375 g (not administered)  vancomycin (VANCOCIN) IVPB 750 mg/150 ml premix (not administered)  midazolam (VERSED) 50 mg in sodium chloride 0.9 % 50 mL (1 mg/mL) infusion (not administered)  midazolam (VERSED) 2 MG/2ML injection (not administered)  fentaNYL (SUBLIMAZE) 100 MCG/2ML injection (not administered)  etomidate (AMIDATE) injection (20 mg Intravenous Given 01/26/2017 1406)  succinylcholine (ANECTINE) injection (100 mg Intravenous Given 01/26/2017 1406)  midazolam (VERSED) injection 2 mg (2 mg Intravenous Given 01/20/2017 1453)  fentaNYL (SUBLIMAZE) injection 25 mcg (25 mcg Intravenous Given 01/24/2017 1453)   CRITICAL CARE Performed by: Threasa Beards Total critical care time: 60 minutes Critical care time was exclusive of separately billable procedures and treating other patients. Critical care was necessary to treat or prevent imminent or life-threatening deterioration. Critical care was time spent personally by me on the following activities: development of treatment plan with patient and/or surrogate as well as nursing, discussions with consultants, evaluation of patient's response to treatment, examination of patient, obtaining history from patient or surrogate, ordering and performing treatments and interventions, ordering and review of laboratory studies, ordering and review of radiographic studies, pulse oximetry and re-evaluation of patient's condition.  INTUBATION Performed by: Threasa Beards  Required items: required blood products, implants, devices,  and special equipment available Patient identity confirmed: provided demographic data and hospital-assigned identification number Time out: Immediately prior to procedure a "time out" was called to verify the correct patient, procedure, equipment, support staff and site/side marked as required.  Indications: airway protection, respiratory arrest  Intubation method: Direct Laryngoscopy   Preoxygenation:BVM  Sedatives: Etomidate Paralytic: Succinylcholine  Tube Size: 7.0 cuffed  Post-procedure assessment: chest rise and ETCO2 monitor Breath sounds: equal and absent over the epigastrium Tube secured with: ETT holder Chest x-ray interpreted by radiologist and me.  Chest x-ray findings: endotracheal tube in appropriate position  Patient tolerated the procedure well with no immediate complications.    Initial Impression / Assessment and Plan / ED Course  I have reviewed the triage vital signs and the nursing notes.  Pertinent labs & imaging results that were available during my care of the patient were reviewed by me and considered  in my medical decision making (see chart for details).  Pt seen immediately upon arrival- he is somnolent, not responsive, but protecting airway SBP 101, HR 60s, O2 sat 100% on NRB.  Workup initiated including ekg which appears unchanged from prior, portable CXR, labs, head CT- ordered, on recheck bp decreasing and O2 sat decreasing, 2 L IV fluids running wide open,  Pt briefly responsive and answered to his name- when asked how he felt he says "I feel sick", then became unresponsive with brief tonic movements/rigidity of his upper extremities prior to intubation again O2 sat decreased, skin mottled- BVM begun and preparations made for RSI.  O2 sats increased after intubation, critical care team consulted emergently.  Versed given for sedation due to low BPS- unsure if his pressures would tolerate propofol- also versed may help if he is having ongoing seizure  activity under paralytics.   Code sepsis and broad spectrum antibiotics initiated due to bilateral infiltrates on CXR, 30cc/kg NS bolus ordered due to hypotension.  Normal WBC and no fever.      2:18 PM d/w Dr. Nelda Marseille, PCCM he will see patient for admisison in the ED.     Of note- mother and father at the bedside before intubation- brief discussion with them- they state he is DNR if care is completely futile, but until we know that they would like for him to be intubated.  Mother states she is power of attorney for patient.      Final Clinical Impressions(s) / ED Diagnoses   Final diagnoses:  Seizure ([place])  Respiratory arrest ([place])  Altered mental status, unspecified altered mental status type  Community acquired pneumonia, unspecified laterality    New Prescriptions New Prescriptions   No medications on file     Alfonzo Beers, MD 01-27-17 [place], MD 01/27/17 [phone]

## 2017-01-17 NOTE — Progress Notes (Signed)
[place] for heparin Indication: atrial fibrillation, history strokes  Heparin Dosing Weight: 59kg   Assessment: 22 yom with history of aflutter on warfarin PTA. Also with noted history of stroke, TCP since 2008. Pharmacy consulted to dose heparin when INR<2. INR was 2.0 on admit this afternoon, last dose of warfarin on 2/9 - ok to start now. Hg 12.5, plt 112. No bleed documented. Noted renal dysfunction. CT head negative for bleed.  Goal of Therapy:  Heparin level 0.3-0.7 units/ml Monitor platelets by anticoagulation protocol: Yes   Plan:  Start heparin at 950 units/h (no bolus) 8h heparin level Daily heparin level/CBC Monitor s/sx bleeding Warfarin on hold   Jacob Nunez, PharmD, BCPS Clinical Pharmacist 01/29/2017 3:47 PM

## 2017-01-17 NOTE — Progress Notes (Signed)
EEG completed, results pending. 

## 2017-01-17 NOTE — Progress Notes (Signed)
Pharmacy Antibiotic Note  Jacob Nunez is a 56 y.o. male admitted on 01/13/2017 with sepsis.  Pharmacy has been consulted for vancomycin and zosyn dosing. Wt 59 kg 11/26/16. Lactate 2.38, HR 50, RR 18. Creat WNL on recent stay. Creat elevated at 4.03, WBC low at 4.3. Normalized creat cl 21 ml/min.  Plan: vancomycin 1 gm IV x 1 and zosyn 3.375 gm IV over 30 minutes for first dose Vancomycin 500 mg IV q24 for vancomycin trough goal 15-20 mcg/ml  Zosyn 3.375 gm IV q8h, infuse each dose over 4 hours F/u renal fxn, wbc, temp, culture data vanc levels as needed  Eudelia Bunch, Pharm.D. [phone] 02/04/2017 4:02 PM   ADDENDUM:  Pharmacy consulted to start ceftriaxone and ampicillin, d/c Zosyn for r/o meningitis treatment. Will follow renal function closely - CrCl~17.  Plan: Ceftriaxone 2g IV q12h Ampicillin 2g IV q8h Continue vancomycin 500 mg IV q24h Monitor clinical progress, c/s, renal function, abx plan/LOT Vancomycin trough as indicated F/u meningitis r/o   Elicia Lamp, PharmD, BCPS Clinical Pharmacist 02/04/2017 4:09 PM      Height: 5\' 5"  (165.1 cm) Weight: 130 lb 1.1 oz (59 kg) IBW/kg (Calculated) : 61.5   No data recorded.   Recent Labs Lab 01/30/2017 1400 01/30/2017 1415  WBC 4.3  --   CREATININE 4.03*  --   LATICACIDVEN  --  2.38*    Estimated Creatinine Clearance: 17.3 mL/min (by C-G formula based on SCr of 4.03 mg/dL (H)).    Allergies  Allergen Reactions  . Zithromax [Azithromycin] Other (See Comments)    Other Reaction: AFFECTS HR Interacts with heart medicine

## 2017-01-17 NOTE — Consult Note (Signed)
Chief Complaint: Seizures HPI: Jacob Nunez is an 56 y.o. male with previous history of severe cardio myopathy and pulmonary disease on home oxygen presented with acute onset of dizziness, nausea, and then fall at home. His mother reports patient was in his usual state of health and after coming back from church he started to complain some dizziness and nausea. He went to lay down and around 10 or 10:30 the her to fall. There continues to and he was on the floor with face down. They called EMS and he was back to his baseline by the time. In route he had another seizure for which he was given some Versed and on presentation to the emergency room he was slightly confused. It is unclear at what time he developed this rigidity in the emergency room and required intubation. He was given 6 mg of IV Ativan and his blood pressure was systolic 93T. It is clarified by RN that rigidity was there prior to the intubation so was not related to neuromuscular blocker.  He has history of stroke which left him with right-sided weakness. He is able to ambulate independently and able to take care of his activities of daily livings. He does have cognitive difficulty. His mother reports he plays golf at times.  He was at time being considered for heart and lung transplant however due to his comorbidities and faucial status he was denied. He currently takes Coumadin for atrial flutter.  History of Stroke: Yes.    Date last known well:Date: 01/09/2017 Time last known well: Time: 10:00  tPA Given: No: Unclear diagnosis of stroke, therapeutic INR, outside of treatment window (patient was evaluated at 3:05 PM) mRankin: probably 2  Past Medical History:  Diagnosis Date  . Arrhythmia   . CHF (congestive heart failure) ([place])   . Coronary artery disease   . GERD (gastroesophageal reflux disease)   . Hypertension   . Stroke ([place])   . Weakness of right arm   . Weakness of right leg     Past Surgical History:  Procedure  Laterality Date  . CARDIAC DEFIBRILLATOR PLACEMENT    . CARDIAC DEFIBRILLATOR PLACEMENT    . PACEMAKER INSERTION      History reviewed. No pertinent family history. Social History:  reports that he has never smoked. He does not have any smokeless tobacco history on file. He reports that he does not drink alcohol or use drugs.  Allergies:  Allergies  Allergen Reactions  . Zithromax [Azithromycin] Other (See Comments)    Other Reaction: AFFECTS HR Interacts with heart medicine     (Not in a hospital admission)  DSK:AJGOTL to obtain due to intubation  Physical Examination: Blood pressure (!) 68/54, pulse (!) 59, resp. rate 19, height 5' 5"  (1.651 m), weight 59 kg (130 lb 1.1 oz), SpO2 99 %.  HEENT-  Normocephalic, no lesions, significant rigidity of the neck was also noted Cardiovascular - regular rate and rhythm, S1, S2 normal, no murmur, click, rub or gallop Lungs - intubated and some rhonchi is appreciated throughout Abdomen - soft nontender and nondistended  Neurologic Examination: Mental status: Intubated without any sedation. Does not wake up. Speech and language: Intubated no verbal output. Does not follow any commands Cranial nerves: Pupils are approximately 2 mm and extremely sluggish to light. When light is turned off the very slowly dilate and upon turning the light on the very slowly constrict. Eyes do not move with oculocephalic. Corneals absent. Unable to check gag as he was  getting an arterial line at that moment. Motor/sensory: Withdraws all 4 extremities to noxious stimulation. There is significant rigidity noted in all 4 extremities. DTRs: Slightly brisk in bilateral lower extremities. Coordination: Unable to test Gait: Unable to test   Results for orders placed or performed during the hospital encounter of 01/30/2017 (from the past 48 hour(s))  CBC     Status: Abnormal   Collection Time: 01/11/2017  2:00 PM  Result Value Ref Range   WBC 4.3 4.0 - 10.5 K/uL    RBC 3.87 (L) 4.22 - 5.81 MIL/uL   Hemoglobin 12.5 (L) 13.0 - 17.0 g/dL   HCT 38.7 (L) 39.0 - 52.0 %   MCV 100.0 78.0 - 100.0 fL   MCH 32.3 26.0 - 34.0 pg   MCHC 32.3 30.0 - 36.0 g/dL   RDW 14.5 11.5 - 15.5 %   Platelets 112 (L) 150 - 400 K/uL    Comment: PLATELET COUNT CONFIRMED BY SMEAR  Comprehensive metabolic panel     Status: Abnormal   Collection Time: 01/20/2017  2:00 PM  Result Value Ref Range   Sodium 136 135 - 145 mmol/L   Potassium 5.9 (H) 3.5 - 5.1 mmol/L   Chloride 102 101 - 111 mmol/L   CO2 23 22 - 32 mmol/L   Glucose, Bld 123 (H) 65 - 99 mg/dL   BUN 57 (H) 6 - 20 mg/dL   Creatinine, Ser 4.03 (H) 0.61 - 1.24 mg/dL   Calcium 8.9 8.9 - 10.3 mg/dL   Total Protein 7.2 6.5 - 8.1 g/dL   Albumin 3.3 (L) 3.5 - 5.0 g/dL   AST 35 15 - 41 U/L   ALT 21 17 - 63 U/L   Alkaline Phosphatase 81 38 - 126 U/L   Total Bilirubin 1.1 0.3 - 1.2 mg/dL   GFR calc non Af Amer 15 (L) >60 mL/min   GFR calc Af Amer 18 (L) >60 mL/min    Comment: (NOTE) The eGFR has been calculated using the CKD EPI equation. This calculation has not been validated in all clinical situations. eGFR's persistently <60 mL/min signify possible Chronic Kidney Disease.    Anion gap 11 5 - 15  Protime-INR     Status: Abnormal   Collection Time: 01/19/2017  2:00 PM  Result Value Ref Range   Prothrombin Time 23.0 (H) 11.4 - 15.2 seconds   INR 2.00   I-Stat Troponin, ED (not at [facility])     Status: None   Collection Time: 01/12/2017  2:13 PM  Result Value Ref Range   Troponin i, poc 0.03 0.00 - 0.08 ng/mL   Comment 3            Comment: Due to the release kinetics of cTnI, a negative result within the first hours of the onset of symptoms does not rule out myocardial infarction with certainty. If myocardial infarction is still suspected, repeat the test at appropriate intervals.   I-Stat CG4 Lactic Acid, ED     Status: Abnormal   Collection Time: 01/09/2017  2:15 PM  Result Value Ref Range   Lactic Acid, Venous 2.38  (HH) 0.5 - 1.9 mmol/L   Comment NOTIFIED PHYSICIAN   I-Stat arterial blood gas, ED     Status: Abnormal   Collection Time: 01/27/2017  3:34 PM  Result Value Ref Range   pH, Arterial 7.269 (L) 7.350 - 7.450   pCO2 arterial 42.1 32.0 - 48.0 mmHg   pO2, Arterial 135.0 (H) 83.0 - 108.0 mmHg  Bicarbonate 19.3 (L) 20.0 - 28.0 mmol/L   TCO2 21 0 - 100 mmol/L   O2 Saturation 99.0 %   Acid-base deficit 7.0 (H) 0.0 - 2.0 mmol/L   Patient temperature 98.6 F    Collection site RADIAL, ALLEN'S TEST ACCEPTABLE    Drawn by Operator    Sample type ARTERIAL    Ct Head Wo Contrast  Result Date: 02/01/2017 CLINICAL DATA:  Altered mental status, possible seizure. History of old CVA EXAM: CT HEAD WITHOUT CONTRAST TECHNIQUE: Contiguous axial images were obtained from the base of the skull through the vertex without intravenous contrast. COMPARISON:  06/09/2016 and previous FINDINGS: Brain: Bilateral frontal infarcts left greater than right, stable. Lacunar infarct in the left basal ganglia, stable. There is no evidence of acute intracranial hemorrhage, brain edema, mass lesion, acute infarction, mass effect, or midline shift. Acute infarct may be inapparent on noncontrast CT. No other intra-axial abnormalities are seen. No hydrocephalus. No abnormal extra-axial fluid collections or masses are identified. Vascular: No hyperdense vessel or unexpected calcification.Dx Skull:  No significant calvarial abnormality. Sinuses/Orbits: Mucoperiosteal thickening in the right maxillary sinus. Remainder of paranasal sinuses and mastoid air cells appear normally developed and well aerated. Orbits unremarkable. Other: None. IMPRESSION: 1. Negative for bleed or other acute intracranial process. 2. Stable old bilateral frontal infarcts left greater than right. Electronically Signed   By: Lucrezia Europe M.D.   On: 02/01/2017 14:53   Dg Chest Portable 1 View  Result Date: 01/09/2017 CLINICAL DATA:  Status post intubation EXAM: PORTABLE  CHEST 1 VIEW COMPARISON:  Film from earlier in the same day FINDINGS: Cardiac shadow is stable. Defibrillator is again noted in stable position. Endotracheal tube is now seen 5.4 cm above the carina in satisfactory position. A nasogastric catheter is noted within the stomach. Persistent bilateral infiltrates are again seen. IMPRESSION: Endotracheal tube and nasogastric catheter in satisfactory position. Stable bilateral infiltrates Electronically Signed   By: Inez Catalina M.D.   On: 02/03/2017 14:34   Dg Chest Port 1 View  Result Date: 02/01/2017 CLINICAL DATA:  Pt had a seizure this morning and was c/o nausea prior to the seizure. Pt has AMS. EXAM: PORTABLE CHEST - 1 VIEW COMPARISON:  12/12/2016 FINDINGS: Mild cardiomegaly . Bilateral subclavian Venous AICD leads extend through intracardiac stent into left coronary sinus, left atrium and left ventricle as before in this patient with a history of transposition of great vessels. Stable epicardial lead. Heart size and mediastinal contours are within normal limits. Widening of the superior mediastinum is emphasized by portable technique. Worsening perihilar airspace opacities bilaterally. Right pleural effusion appears slightly larger than on previous exam. No pneumothorax. IMPRESSION: 1. Worsening bilateral infiltrates or edema. 2. Suspect slight increase in right pleural effusion. Electronically Signed   By: Lucrezia Europe M.D.   On: 01/16/2017 13:59   Assessment: 56 y.o. male presented with acute onset altered mental status with significant rigidity. Had a seizure enroute with acute renal failure. Based on poor brainstem reflexes and symptoms of dizziness, nausea and poor heat function I am worried about posterior circulation stroke. He is outside the window for tPA and does not qualify due to therapeutic INR. I recommended to obtain emergent CTA head and neck despite the elevation in creatinine. I had a long conversation with family regarding my fear of posterior  circulation and if missed could be lethal. Giving contrast could potentially make him dialysis dependent and family after giving it a thought among themselves agreed to it. After CTA  if no abnormality is found will do a prolong EEG monitoring (EEG tech already notified). Loaded with Keppra 1032m Once and start maintenance renally doses.  Would not prefer to use Fosphenytoin due to cardiomyopathy and low BP already. FLarey Dayscan further lead to cardiopulmonary collapse.  Would like his pressure to be high in the setting of possible vertebrobasilar occlusion. Until this is excluded keep BP close to 220/110.  I noticed a pacemaker/diffibrilator in his left chest and will need clarification if this MRI compatible prior to ordering MRI.  Stroke Risk Factors - atrial fibrillation, hypercoagulable state and hypertension  Plan: HgbA1c, fasting lipid panel MRI brain after clarifying if defibrillator is compatible. PT consult, OT consult, Speech consult when stable. Echocardiogram to look for any further worsening of EF and whether or not any evidence of thrombus. Prophylactic therapy-Anticoagulation: Coumadin- Continue home dose. Risk factor modification Telemetry monitoring  Stroke education, treatment, and current plan discussed with patient/significant other: Yes.     This patient is critically ill and at significant risk of neurological worsening, death and care requires constant monitoring of vital signs, hemodynamics,respiratory and cardiac monitoring, neurological assessment, discussion with family, other specialists and medical decision making of high complexity. I spent 60 minutes of neurocritical care time  in the care of  this patient.  01/28/2017  4:29 PM  QRochele Raring2/09/2017, 4:09 PM

## 2017-01-17 NOTE — Progress Notes (Signed)
vLTM EEG running. Event button tested. Notified neuro. disregard previous note of eeg being completed

## 2017-01-17 NOTE — Procedures (Signed)
Central Venous Catheter Insertion Procedure Note Jacob Nunez [id number] June 11, 1961  Procedure: Insertion of Central Venous Catheter Indications: Assessment of intravascular volume, Drug and/or fluid administration and Frequent blood sampling  Procedure Details Consent: Risks of procedure as well as the alternatives and risks of each were explained to the (patient/caregiver).  Consent for procedure obtained. Time Out: Verified patient identification, verified procedure, site/side was marked, verified correct patient position, special equipment/implants available, medications/allergies/relevent history reviewed, required imaging and test results available.  Performed  Maximum sterile technique was used including antiseptics, cap, gloves, gown, hand hygiene, mask and sheet. Skin prep: Chlorhexidine; local anesthetic administered A antimicrobial bonded/coated triple lumen catheter was placed in the right internal jugular vein using the Seldinger technique.  Evaluation Blood flow good Complications: No apparent complications Patient did tolerate procedure well. Chest X-ray ordered to verify placement.  CXR: pending.  U/S used in placement  Jacob Nunez 01/31/2017, 4:05 PM

## 2017-01-17 NOTE — Progress Notes (Signed)
RT note: patient ETT advanced 2cm per MD order.

## 2017-01-17 NOTE — Procedures (Signed)
Intubation Procedure Note Jacob Nunez [id number] 1961/08/14  Procedure: Intubation Indications: Airway protection and maintenance  Procedure Details Consent: Risks of procedure as well as the alternatives and risks of each were explained to the (patient/caregiver).  Consent for procedure obtained. Time Out: Verified patient identification, verified procedure, site/side was marked, verified correct patient position, special equipment/implants available, medications/allergies/relevent history reviewed, required imaging and test results available.  Performed  Maximum sterile technique was used including antiseptics, cap, gloves, gown, hand hygiene, mask and sheet.  MAC and 4    Evaluation Hemodynamic Status: BP stable throughout; O2 sats: stable throughout Patient's Current Condition: stable Complications: No apparent complications Patient did tolerate procedure well. Chest X-ray ordered to verify placement.  CXR: tube position acceptable   Patient intubated by ED MD.  Positive color change noted.  Condensation noted in ETT.  Bilateral breath sounds auscultated.  Chest xray obtained and ETT in good position.  Will continue to monitor.    Philomena Doheny 01/08/2017

## 2017-01-17 NOTE — ED Notes (Signed)
Notified MD Linker of lab results

## 2017-01-17 NOTE — Progress Notes (Signed)
Patients family requested to speak with Neurologist about CT results. Neurologist paged.

## 2017-01-17 NOTE — ED Triage Notes (Signed)
Per EMS: pt from home c/o syncopal episode this am; pt has defib; pt had witnessed seizure like activity by EMS given 5mg  versed and given 4mg  zofran; pt was c/o nausea prior to EMS arrival; IV L FA 18g

## 2017-01-17 NOTE — Progress Notes (Signed)
Patient transported from ED to room [id number] without complications.

## 2017-01-17 NOTE — Progress Notes (Signed)
eLink Physician-Brief Progress Note Patient Name: Jacob Nunez DOB: [birth year] MRN: [id number]   Date of Service  01/11/2017  HPI/Events of Note  Notified by bedside nurse that patient is requiring significant vasopressors.   eICU Interventions  Applying noninvasive monitoring     Intervention Category Major Interventions: Shock - evaluation and management  Tera Partridge 01/17/2017, 9:25 PM

## 2017-01-17 NOTE — Progress Notes (Signed)
eLink Physician-Brief Progress Note Patient Name: Jacob Nunez DOB: 05/31/1961 MRN: [id number]   Date of Service  01/16/2017  HPI/Events of Note  Patient admitted with altered mental status and syncope. Lactic acid trending downward. Neurology consulted.   eICU Interventions  Continuing current plan of care per neurologist and intensivist.      Intervention Category Major Interventions: Sepsis - evaluation and management Evaluation Type: New Patient Evaluation  Tera Partridge 01/21/2017, 6:55 PM

## 2017-01-18 DIAGNOSIS — J189 Pneumonia, unspecified organism: Secondary | ICD-10-CM

## 2017-01-18 DIAGNOSIS — R4182 Altered mental status, unspecified: Secondary | ICD-10-CM

## 2017-01-18 LAB — BASIC METABOLIC PANEL
ANION GAP: 10 (ref 5–15)
BUN: 56 mg/dL — ABNORMAL HIGH (ref 6–20)
CO2: 23 mmol/L (ref 22–32)
Calcium: 7.2 mg/dL — ABNORMAL LOW (ref 8.9–10.3)
Chloride: 104 mmol/L (ref 101–111)
Creatinine, Ser: 3.8 mg/dL — ABNORMAL HIGH (ref 0.61–1.24)
GFR, EST AFRICAN AMERICAN: 19 mL/min — AB (ref >60)
GFR, EST NON AFRICAN AMERICAN: 16 mL/min — AB (ref >60)
Glucose, Bld: 201 mg/dL — ABNORMAL HIGH (ref 65–99)
POTASSIUM: 5 mmol/L (ref 3.5–5.1)
SODIUM: 137 mmol/L (ref 135–145)

## 2017-01-18 LAB — POCT I-STAT 3, ART BLOOD GAS (G3+)
Acid-base deficit: 2 mmol/L (ref 0.0–2.0)
Bicarbonate: 20.8 mmol/L (ref 20.0–28.0)
O2 SAT: 99 %
PCO2 ART: 30.1 mmHg — AB (ref 32.0–48.0)
TCO2: 22 mmol/L (ref 0–100)
pH, Arterial: 7.447 (ref 7.350–7.450)
pO2, Arterial: 110 mmHg — ABNORMAL HIGH (ref 83.0–108.0)

## 2017-01-18 LAB — CBC
HCT: 37.7 % — ABNORMAL LOW (ref 39.0–52.0)
Hemoglobin: 12.4 g/dL — ABNORMAL LOW (ref 13.0–17.0)
MCH: 32 pg (ref 26.0–34.0)
MCHC: 32.9 g/dL (ref 30.0–36.0)
MCV: 97.4 fL (ref 78.0–100.0)
PLATELETS: 114 10*3/uL — AB (ref 150–400)
RBC: 3.87 MIL/uL — AB (ref 4.22–5.81)
RDW: 14.2 % (ref 11.5–15.5)
WBC: 9.4 10*3/uL (ref 4.0–10.5)

## 2017-01-18 LAB — MAGNESIUM: MAGNESIUM: 2.1 mg/dL (ref 1.7–2.4)

## 2017-01-18 LAB — GLUCOSE, CAPILLARY
GLUCOSE-CAPILLARY: 170 mg/dL — AB (ref 65–99)
GLUCOSE-CAPILLARY: 166 mg/dL — AB (ref 65–99)
GLUCOSE-CAPILLARY: 135 mg/dL — AB (ref 65–99)
Glucose-Capillary: 155 mg/dL — ABNORMAL HIGH (ref 65–99)
Glucose-Capillary: 152 mg/dL — ABNORMAL HIGH (ref 65–99)

## 2017-01-18 LAB — COOXEMETRY PANEL
Carboxyhemoglobin: 0.5 % (ref 0.5–1.5)
Carboxyhemoglobin: 0.7 % (ref 0.5–1.5)
METHEMOGLOBIN: 1 % (ref 0.0–1.5)
METHEMOGLOBIN: 0.9 % (ref 0.0–1.5)
O2 Saturation: 43.5 %
O2 Saturation: 93.7 %
TOTAL HEMOGLOBIN: 13 g/dL (ref 12.0–16.0)
TOTAL HEMOGLOBIN: 13.7 g/dL (ref 12.0–16.0)

## 2017-01-18 LAB — LACTIC ACID, PLASMA
Lactic Acid, Venous: 1.9 mmol/L (ref 0.5–1.9)
Lactic Acid, Venous: 2 mmol/L (ref 0.5–1.9)

## 2017-01-18 LAB — PROTIME-INR
INR: 3.18
PROTHROMBIN TIME: 33.3 s — AB (ref 11.4–15.2)

## 2017-01-18 LAB — PHOSPHORUS: PHOSPHORUS: 3.4 mg/dL (ref 2.5–4.6)

## 2017-01-18 LAB — HEPARIN LEVEL (UNFRACTIONATED)
Heparin Unfractionated: 0.32 IU/mL (ref 0.30–0.70)
Heparin Unfractionated: 0.37 IU/mL (ref 0.30–0.70)

## 2017-01-18 LAB — MRSA PCR SCREENING: MRSA BY PCR: NEGATIVE

## 2017-01-18 MED ORDER — VANCOMYCIN HCL IN DEXTROSE 1-5 GM/200ML-% IV SOLN: 1000.0000 mg | INTRAVENOUS | Status: DC

## 2017-01-18 MED ORDER — NOREPINEPHRINE BITARTRATE 1 MG/ML IV SOLN
0.0000 ug/min | INTRAVENOUS | Status: DC
  Administered 2017-01-18 (×2): 40 ug/min via INTRAVENOUS
  Filled 2017-01-18 (×4): qty 16

## 2017-01-18 MED ORDER — LEVETIRACETAM 500 MG/5ML IV SOLN
500.0000 mg | INTRAVENOUS | Status: DC
  Administered 2017-01-18: 500 mg via INTRAVENOUS
  Filled 2017-01-18: qty 5

## 2017-01-18 MED ORDER — SODIUM BICARBONATE 8.4 % IV SOLN
INTRAVENOUS | Status: AC
  Filled 2017-01-18: qty 100

## 2017-01-18 MED ORDER — SODIUM CHLORIDE 0.9 % IV SOLN
0.0000 ug/min | INTRAVENOUS | Status: DC
  Administered 2017-01-18: 20 ug/min via INTRAVENOUS
  Administered 2017-01-18: 45 ug/min via INTRAVENOUS
  Administered 2017-01-18: 40 ug/min via INTRAVENOUS
  Filled 2017-01-18 (×4): qty 1

## 2017-01-18 MED ORDER — DOBUTAMINE IN D5W 4-5 MG/ML-% IV SOLN: 7.5000 ug/kg/min | INTRAVENOUS | Status: DC

## 2017-01-18 MED ORDER — CALCIUM GLUCONATE 10 % IV SOLN
1.0000 g | Freq: Once | INTRAVENOUS | Status: AC
  Administered 2017-01-18: 1 g via INTRAVENOUS
  Filled 2017-01-18: qty 10

## 2017-01-18 MED ORDER — SODIUM BICARBONATE 8.4 % IV SOLN
100.0000 meq | Freq: Once | INTRAVENOUS | Status: AC
  Administered 2017-01-18: 100 meq via INTRAVENOUS

## 2017-01-18 MED ORDER — DOBUTAMINE IN D5W 4-5 MG/ML-% IV SOLN
5.0000 ug/kg/min | INTRAVENOUS | Status: DC
  Administered 2017-01-18: 2.5 ug/kg/min via INTRAVENOUS
  Filled 2017-01-18: qty 250

## 2017-01-18 MED ORDER — DEXTROSE 50 % IV SOLN
INTRAVENOUS | Status: AC
  Filled 2017-01-18: qty 50

## 2017-01-18 MED ORDER — LACTATED RINGERS IV BOLUS (SEPSIS)
500.0000 mL | Freq: Once | INTRAVENOUS | Status: AC
  Administered 2017-01-18: 250 mL via INTRAVENOUS

## 2017-01-18 MED ORDER — SODIUM CHLORIDE 0.9 % IV SOLN
2.0000 mg/h | INTRAVENOUS | Status: DC
  Administered 2017-01-18: 3 mg/h via INTRAVENOUS
  Filled 2017-01-18: qty 10

## 2017-01-19 LAB — URINE CULTURE: Culture: NO GROWTH

## 2017-01-21 ENCOUNTER — Telehealth: Payer: Self-pay

## 2017-01-21 NOTE — Telephone Encounter (Signed)
On 02/12/2017 I received a death certificate from [facility] (original). The death certificate is for cremation. The patient is a patient of Doctor Nelda Marseille. The death certificate will be taken to [facility] ([address]) this am for signature.  On 02/12/2017 I received the death certificate back from Doctor Nelda Marseille. I got the death certificate ready and called the funeral home to let them know the death certificate is ready for pickup. I also faxed a copy to the funeral home per the funeral home request.

## 2017-01-22 LAB — CULTURE, BLOOD (ROUTINE X 2)
CULTURE: NO GROWTH
Culture: NO GROWTH

## 2017-02-05 NOTE — Progress Notes (Signed)
[employer] rep made aware, instructions to place magnet over AICD rep to come to bedside if needed.  Desmond Dike RN

## 2017-02-05 NOTE — Progress Notes (Signed)
[employer] rep came to bedside and deactivated cardiac defibrillator. Paperwork in chart.  Desmond Dike RN

## 2017-02-05 NOTE — Progress Notes (Signed)
vLTM EEG running. EEG maint complete. No skin breakdown

## 2017-02-05 NOTE — Discharge Summary (Signed)
Jacob Nunez, Jacob Nunez NO.:  [id number]  MEDICAL RECORD NO.:  [id number]  LOCATION:                                 FACILITY:  PHYSICIAN:  Providence Lanius, MD       DATE OF BIRTH:  DATE OF ADMISSION:  01/27/2017 DATE OF DISCHARGE:                              DISCHARGE SUMMARY   DEATH SUMMARY  PRIMARY DIAGNOSIS/CAUSE OF DEATH:  Seizure.  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  Had extensive cardiac history as well, who __________ falling and was labile with syncopal episode.  At which point, the patient was able to get up and walk to the ambulance and in the ambulance, had another episode where he lost consciousness, and was brought to the emergency department.  In the emergency department, it was noted that the patient was very rigid and cyanotic, was intubated at that point.  PCCM was called to evaluate. __________ the patient was found to be very rigid, concern for seizure. Neurology was called.  Seizure treatment was started and the patient was admitted to the intensive care unit.  On the following day, it was noted that the seizure activity had stopped; however, renal function continued to deteriorate.  The patient developed shock of unknown origin, was on vasopressin, Neo-Synephrine, Levophed and dobutamine with continuous drop in blood pressure.  Had an extensive conversation with the family and informed that neurologically to be improving.  His shock is refractory to treatment at this point and informed that the patient has made his wishes clearly in the past that he did not wish for  any aggressive interventions and after that discussion, decision was made to choose the least traumatic way for the patient to expire, which was discontinuation of all vasopressors.  Vasopressors were discontinued, blood pressure continued to drop and the patient expired shortly thereafter with the family at bedside, but morphine being infused.     Providence Lanius, MD   ______________________________ Providence Lanius, MD    WJY/MEDQ  D:  01/21/2017  T:  01/22/2017  Job:  [id number]

## 2017-02-05 NOTE — Progress Notes (Signed)
Subjective: Interval History:  Looking at the ED note it appears that patient was becoming hypotensive and desaturating prior to developing this rigidity and then required intubation. Antibiotics were started in ED due to possible sepsis with pneumonia on CXR.   Objective: Vital signs in last 24 hours: Temp:  [96 F (35.6 C)-101 F (38.3 C)] 101 F (38.3 C) (02/11 0700) Pulse Rate:  [58-77] 62 (02/11 0830) Resp:  [0-28] 28 (02/11 0830) BP: (38-91)/(19-68) 38/19 (02/11 0815) SpO2:  [90 %-100 %] 97 % (02/11 0830) Arterial Line BP: (54-91)/(40-69) 78/55 (02/11 0830) FiO2 (%):  [60 %-100 %] 60 % (02/11 0800) Weight:  [59 kg (130 lb 1.1 oz)-66.1 kg (145 lb 11.6 oz)] 66.1 kg (145 lb 11.6 oz) (02/11 0348)  Intake/Output from previous day: 02/10 0701 - 02/11 0700 In: 1908.6 [I.V.:1808.6; IV Piggyback:100] Out: 105 [Urine:105] Intake/Output this shift: Total I/O In: 72.4 [I.V.:72.4] Out: 65 [Urine:65] Nutritional status: Diet NPO time specified  HEENT-  Normocephalic, no lesions, Neck supple and no rigidity was appreciated.  Neurologic Examination: Mental status: Intubated and not on any sedation at this point. Sedation was stopped last night. Speech and language: Intubated and does not follow any commands. Cranial nerves: Pupils approximately 4 mm and slowly reactive to light. Extra muscles intact when he opened his eyes. Gag intact. Corneal intact bilaterally Motor/Sensory: Withdraws very minimally to noxious stimulation in 3 extremities except in RUE.   Lab Results:  Recent Labs  01/29/2017 1720 01/25/2017 1826 2017-01-26 0245  WBC 6.6  --  9.4  HGB 12.2*  --  12.4*  HCT 38.0*  --  37.7*  PLT 101*  --  114*  NA  --  132* 137  K  --  6.0* 5.0  CL  --  107 104  CO2  --  17* 23  GLUCOSE  --  163* 201*  BUN  --  54* 56*  CREATININE  --  3.61* 3.80*  CALCIUM  --  7.2* 7.2*   Lipid Panel No results for input(s): CHOL, TRIG, HDL, CHOLHDL, VLDL, LDLCALC in the last 72  hours.  Studies/Results: Ct Angio Head W Or Wo Contrast  Result Date: 01/29/2017 CLINICAL DATA:  Seizures, repeated. Old stroke with right-sided weakness. Cardiomyopathy. EXAM: CT ANGIOGRAPHY HEAD AND NECK TECHNIQUE: Multidetector CT imaging of the head and neck was performed using the standard protocol during bolus administration of intravenous contrast. Multiplanar CT image reconstructions and MIPs were obtained to evaluate the vascular anatomy. Carotid stenosis measurements (when applicable) are obtained utilizing NASCET criteria, using the distal internal carotid diameter as the denominator. CONTRAST:  50 cc Isovue 370 COMPARISON:  Head CT same day FINDINGS: CTA NECK FINDINGS Aortic arch: Atherosclerosis of the arch. No aneurysm or dissection. Markedly dilated main pulmonary artery incidentally noted. Right carotid system: Innominate artery widely patent. Right with slow flow. 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 No antegrade flow seen in the cervical ICA. Left carotid system: Left common carotid artery origin widely patent. Common carotid artery shows slow flow but is patent to the bifurcation. Dense calcified plaque at the carotid bifurcation. Stenosis of the proximal ICA estimated at 50%. Detail is limited because of artifact from the dense calcification as well as dense venous reflux. Left cervical internal carotid artery is small vessel which shows areas of calcification but does show patency to the skullbase. Vertebral arteries: Right vertebral artery origin shows calcified plaque with stenosis of 30%. Beyond that, the right vertebral artery is patent through the cervical  region to the foramen magnum. 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 The vessel is occluded. There is reconstitution of flow in the upper cervical region by cervical collaterals. Left subclavian artery appears chronically occluded, a small vessel with a string of calcifications. Small reconstituted left  subclavian artery. Skeleton: Ordinary cervical spondylosis. Other neck: Muscular hemorrhage of the right sternocleidomastoid muscle due to penetration of right IJ central line. Upper chest: Bilateral pleural effusions more extensive on the right than the left with dependent pulmonary collapse/infiltrate. Review of the MIP images confirms the above findings CTA HEAD FINDINGS Anterior circulation: Right internal carotid artery does not show antegrade flow at the skullbase. There is reconstitution in the siphon region presumably from external to internal collaterals. Right supraclinoid ICA is a small vessel that supplies the right middle and anterior cerebral arteries. Dense calcification in the region of the right A1 segment which was present previously, presumably atherosclerotic. Distal vessels not well evaluated because of poor opacification. Left internal carotid artery patent through the skullbase and siphon region. Supraclinoid ICA is patent. Left anterior and middle cerebral arteries are patent. Focal calcification in the left parietal region presumably relates to an old calcified embolus. Distal vessels show atherosclerotic narrowing and irregularity diffusely. Posterior circulation: Both vertebral artery show flow at the foramen magnum level with the right being larger than the left. Both vessels are reach the basilar. No basilar stenosis. Small posterior inferior cerebellar arteries are visible. Superior cerebellar and posterior cerebral arteries show flow bilaterally. Venous sinuses: Patent and normal Anatomic variants: None significant Delayed phase: No abnormal enhancement. Old infarctions as previously demonstrated. Review of the MIP images confirms the above findings IMPRESSION: Limited quality study because of very poor vascular opacification. 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 Right ICA occluded at the origin  and reconstituted in the siphon by external to internal collaterals. Right anterior and middle cerebral vessels are patent but show distal vessel atherosclerotic irregularity. Dense calcific plaque at the left carotid bifurcation with stenosis of the proximal ICA estimated at 30-50%. Limited detail because of streak artifact from the dense calcification as well as dense venous reflux. Left anterior and middle cerebral vessels are patent but show distal vessel atherosclerotic irregularity. Left subclavian artery occlusion proximally. Small reconstituted left subclavian artery serving the upper extremity. 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 Right vertebral artery shows calcification at the origin with stenosis estimated at 30%. Both vertebral arteries reach the basilar. The basilar is patent. There is flow in both posterior inferior cerebellar arteries, both superior cerebellar arteries and both posterior cerebral arteries. Electronically Signed   By: Nelson Chimes M.D.   On: 01/16/2017 18:45   Ct Head Wo Contrast  Result Date: 01/26/2017 CLINICAL DATA:  Altered mental status, possible seizure. History of old CVA EXAM: CT HEAD WITHOUT CONTRAST TECHNIQUE: Contiguous axial images were obtained from the base of the skull through the vertex without intravenous contrast. COMPARISON:  06/09/2016 and previous FINDINGS: Brain: Bilateral frontal infarcts left greater than right, stable. Lacunar infarct in the left basal ganglia, stable. There is no evidence of acute intracranial hemorrhage, brain edema, mass lesion, acute infarction, mass effect, or midline shift. Acute infarct may be inapparent on noncontrast CT. No other intra-axial abnormalities are seen. No hydrocephalus. No abnormal extra-axial fluid collections or masses are identified.  Vascular: No hyperdense vessel or unexpected  calcification.Dx Skull:  No significant calvarial abnormality. Sinuses/Orbits: Mucoperiosteal thickening in the right maxillary sinus. Remainder of paranasal sinuses and mastoid air cells appear normally developed and well aerated. Orbits unremarkable. Other: None. IMPRESSION: 1. Negative for bleed or other acute intracranial process. 2. Stable old bilateral frontal infarcts left greater than right. Electronically Signed   By: Lucrezia Europe M.D.   On: 01/10/2017 14:53   Ct Angio Neck W Or Wo Contrast  Result Date: 01/30/2017 CLINICAL DATA:  Seizures, repeated. Old stroke with right-sided weakness. Cardiomyopathy. EXAM: CT ANGIOGRAPHY HEAD AND NECK TECHNIQUE: Multidetector CT imaging of the head and neck was performed using the standard protocol during bolus administration of intravenous contrast. Multiplanar CT image reconstructions and MIPs were obtained to evaluate the vascular anatomy. Carotid stenosis measurements (when applicable) are obtained utilizing NASCET criteria, using the distal internal carotid diameter as the denominator. CONTRAST:  50 cc Isovue 370 COMPARISON:  Head CT same day FINDINGS: CTA NECK FINDINGS Aortic arch: Atherosclerosis of the arch. No aneurysm or dissection. Markedly dilated main pulmonary artery incidentally noted. Right carotid system: Innominate artery widely patent. Right with slow flow. 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 No antegrade flow seen in the cervical ICA. Left carotid system: Left common carotid artery origin widely patent. Common carotid artery shows slow flow but is patent to the bifurcation. Dense calcified plaque at the carotid bifurcation. Stenosis of the proximal ICA estimated at 50%. Detail is limited because of artifact from the dense calcification as well as dense venous reflux. Left cervical internal carotid artery is small vessel which shows areas of calcification but does show patency to the skullbase. Vertebral arteries: Right vertebral artery origin shows  calcified plaque with stenosis of 30%. Beyond that, the right vertebral artery is patent through the cervical region to the foramen magnum. 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 The vessel is occluded. There is reconstitution of flow in the upper cervical region by cervical collaterals. Left subclavian artery appears chronically occluded, a small vessel with a string of calcifications. Small reconstituted left subclavian artery. Skeleton: Ordinary cervical spondylosis. Other neck: Muscular hemorrhage of the right sternocleidomastoid muscle due to penetration of right IJ central line. Upper chest: Bilateral pleural effusions more extensive on the right than the left with dependent pulmonary collapse/infiltrate. Review of the MIP images confirms the above findings CTA HEAD FINDINGS Anterior circulation: Right internal carotid artery does not show antegrade flow at the skullbase. There is reconstitution in the siphon region presumably from external to internal collaterals. Right supraclinoid ICA is a small vessel that supplies the right middle and anterior cerebral arteries. Dense calcification in the region of the right A1 segment which was present previously, presumably atherosclerotic. Distal vessels not well evaluated because of poor opacification. Left internal carotid artery patent through the skullbase and siphon region. Supraclinoid ICA is patent. Left anterior and middle cerebral arteries are patent. Focal calcification in the left parietal region presumably relates to an old calcified embolus. Distal vessels show atherosclerotic narrowing and irregularity diffusely. Posterior circulation: Both vertebral artery show flow at the foramen magnum level with the right being larger than the left. Both vessels are reach the basilar. No basilar stenosis. Small posterior inferior cerebellar arteries are visible. Superior cerebellar and posterior cerebral arteries show flow bilaterally. Venous  sinuses: Patent and normal Anatomic variants: None significant Delayed phase: No abnormal enhancement. Old infarctions as previously demonstrated. Review of the MIP images confirms the above  findings IMPRESSION: Limited quality study because of very poor vascular opacification. 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 Right anterior and middle cerebral vessels are patent but show distal vessel atherosclerotic irregularity. Dense calcific plaque at the left carotid bifurcation with stenosis of the proximal ICA estimated at 30-50%. Limited detail because of streak artifact from the dense calcification as well as dense venous reflux. Left anterior and middle cerebral vessels are patent but show distal vessel atherosclerotic irregularity. Left subclavian artery occlusion proximally. Small reconstituted left subclavian artery serving the upper extremity. 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 Right vertebral artery shows calcification at the origin with stenosis estimated at 30%. Both vertebral arteries reach the basilar. The basilar is patent. There is flow in both posterior inferior cerebellar arteries, both superior cerebellar arteries and both posterior cerebral arteries. Electronically Signed   By: Nelson Chimes M.D.   On: 01/16/2017 18:45   Dg Chest Portable 1 View  Result Date: 01/16/2017 CLINICAL DATA:  Central line placement. EXAM: PORTABLE CHEST 1 VIEW COMPARISON:  01/27/2017 FINDINGS: Endotracheal tube and NG tube unchanged. Introduction of RIGHT central venous line with tip in distal SVC. No pneumothorax. Small RIGHT effusion and airspace disease not changed. IMPRESSION: 1. 1. RIGHT central venous  line placed without pneumothorax. 2. Lateral RIGHT effusion and lower lobe airspace disease. Electronically Signed   By: Suzy Bouchard M.D.   On: 02/01/2017 16:23   Dg Chest Portable 1 View  Result Date: 01/25/2017 CLINICAL DATA:  Status post intubation EXAM: PORTABLE CHEST 1 VIEW COMPARISON:  Film from earlier in the same day FINDINGS: Cardiac shadow is stable. Defibrillator is again noted in stable position. Endotracheal tube is now seen 5.4 cm above the carina in satisfactory position. A nasogastric catheter is noted within the stomach. Persistent bilateral infiltrates are again seen. IMPRESSION: Endotracheal tube and nasogastric catheter in satisfactory position. Stable bilateral infiltrates Electronically Signed   By: Inez Catalina M.D.   On: 01/27/2017 14:34   Dg Chest Port 1 View  Result Date: 01/10/2017 CLINICAL DATA:  Pt had a seizure this morning and was c/o nausea prior to the seizure. Pt has AMS. EXAM: PORTABLE CHEST - 1 VIEW COMPARISON:  12/12/2016 FINDINGS: Mild cardiomegaly . Bilateral subclavian Venous AICD leads extend through intracardiac stent into left coronary sinus, left atrium and left ventricle as before in this patient with a history of transposition of great vessels. Stable epicardial lead. Heart size and mediastinal contours are within normal limits. Widening of the superior mediastinum is emphasized by portable technique. Worsening perihilar airspace opacities bilaterally. Right pleural effusion appears slightly larger than on previous exam. No pneumothorax. IMPRESSION: 1. Worsening bilateral infiltrates or edema. 2. Suspect slight increase in right pleural effusion. Electronically Signed   By: Lucrezia Europe M.D.   On: 01/12/2017 13:59    Medications:  Scheduled: . ampicillin (OMNIPEN) IV  2 g Intravenous Q8H  . aspirin  324 mg Oral NOW   Or  . aspirin  300 mg Rectal NOW  . cefTRIAXone (ROCEPHIN)  IV  2 g Intravenous Q12H  . chlorhexidine gluconate (MEDLINE KIT)  15 mL  Mouth Rinse BID  . dextrose      . mouth rinse  15 mL Mouth  Rinse QID  . pantoprazole (PROTONIX) IV  40 mg Intravenous QHS  . sodium bicarbonate      . vancomycin  500 mg Intravenous Q24H   Continuous: . sodium chloride    . DOBUTamine 7.5 mcg/kg/min (2017/01/21 0600)  . fentaNYL infusion INTRAVENOUS Stopped (2017/01/21 0245)  . heparin 950 Units/hr (01-21-2017 0600)  . midazolam (VERSED) infusion Stopped (2017/01/21 0245)  . norepinephrine (LEVOPHED) Adult infusion 40 mcg/min (01/21/17 0600)  . phenylephrine (NEO-SYNEPHRINE) Adult infusion 30 mcg/min (01/21/17 0644)  . propofol (DIPRIVAN) infusion    . vasopressin (PITRESSIN) infusion - *FOR SHOCK* 0.03 Units/min (01/21/17 0600)   WKG:SUPJSR chloride  Assessment/Plan: 56 years old male with hypotension and desaturation had a seizure in ambulance and then possibly in the Er. I think this is a seizure in the setting of hypoperfusion to brain while he has low threshold for seizures due to multiple old strokes. His Neurological exam is improving however BP was still severely low into 80s and 90s despite being on pressors.   -He will need repeat imagine of brain either CT head or Brain MRI when hemodynamically stable. -cont with prolong EEG monitoring for another day to rule out any epileptiform discharges. -Added Keppra 515m daily (renally dosed). When renal function improves this dose can be adjusted. -consider echocardiogram to further evaluate for cardiogenic shock. -Avoid sedation unless necessary.  Neurology will continue to follow along.  This patient is critically ill and at significant risk of neurological worsening, death and care requires constant monitoring of vital signs, hemodynamics,respiratory and cardiac monitoring, neurological assessment, discussion with family, other specialists and medical decision making of high complexity. I spent 30 minutes of neurocritical care time in the care of  this patient.  2February 14, 2018 9:28 AM    LOS: 1 day   QRochele Raring

## 2017-02-05 NOTE — Progress Notes (Addendum)
ANTICOAGULATION CONSULT NOTE - Follow Up Consult  Pharmacy Consult for Heparin Indication: atrial fibrillation, h/o CVA  Allergies  Allergen Reactions  . Zithromax [Azithromycin] Other (See Comments)    Other Reaction: AFFECTS HR Interacts with heart medicine    Patient Measurements: Height: 5\' 5"  (165.1 cm) Weight: 145 lb 11.6 oz (66.1 kg) IBW/kg (Calculated) : 61.5 Heparin Dosing Weight: 59 kg  Vital Signs: Temp: 101 F (38.3 C) (02/11 0700) Temp Source: Oral (02/11 0700) BP: 38/19 (02/11 0815) Pulse Rate: 62 (02/11 0830)  Labs:  Recent Labs  01/21/2017 1400 01/22/2017 1720 01/16/2017 1826 2017/02/04 0035 02/04/2017 0245 February 04, 2017 0823  HGB 12.5* 12.2*  --   --  12.4*  --   HCT 38.7* 38.0*  --   --  37.7*  --   PLT 112* 101*  --   --  114*  --   LABPROT 23.0* 25.3*  --   --   --   --   INR 2.00 2.26  --   --   --   --   HEPARINUNFRC  --   --   --  0.32  --  0.37  CREATININE 4.03*  --  3.61*  --  3.80*  --     Estimated Creatinine Clearance: 19.1 mL/min (by C-G formula based on SCr of 3.8 mg/dL (H)).  Assessment: 65 yom with history of aflutter, CVA on warfarin PTA. Heparin level remains therapeutic (0.37) on gtt at 950 units/hr. No bleeding noted. Platelets are low but stable and H/H is stable.   Goal of Therapy:  Heparin level 0.3-0.7 units/ml Monitor platelets by anticoagulation protocol: Yes   Plan:  Continue heparin 950 units/hr Daily heparin level and CBC  Salome Arnt, PharmD, BCPS Pager # [phone] February 04, 2017 9:33 AM   Addendum: INR checked and it has increased to >3. Will hold heparin for now and resume when INR<2.   Salome Arnt, PharmD, BCPS Pager # [phone] February 04, 2017 11:35 AM

## 2017-02-05 NOTE — Progress Notes (Signed)
At 2:30 patients MAP at 65 and SBP at 75 and steadily dropping. [facility] MD made aware and 2 amps of bicarb and 10mg  of calcium gluconate ordered. Upon reentering the room patients sbp in the 40s. Elink button pressed and bicarb and calcium gluconate given. Order for neosynephrine placed. Dr Diona Fanti came to lay eyes on the patient. ABG drawn, 500CC bolus of LR started (half given due to CVP of 27), fentanyl and versed cut off, carboxyhemoglobin drawn, dobutamine started and blood sugar of 170 obtained. Due to ABG results, vent settings changed. Will continue to closely monitor.

## 2017-02-05 NOTE — Progress Notes (Signed)
At 2030...200 cc morphine infusion 250mg /250 cc concentration and 20 cc versed infusion 50mg /50cc concentration wasted in sink and witnessed by nurse felicia

## 2017-02-05 NOTE — Progress Notes (Signed)
ANTICOAGULATION CONSULT NOTE - Follow Up Consult  Pharmacy Consult for Heparin Indication: atrial fibrillation, h/o CVA  Allergies  Allergen Reactions  . Zithromax [Azithromycin] Other (See Comments)    Other Reaction: AFFECTS HR Interacts with heart medicine    Patient Measurements: Height: 5\' 5"  (165.1 cm) Weight: 130 lb 1.1 oz (59 kg) IBW/kg (Calculated) : 61.5 Heparin Dosing Weight: 59 kg  Vital Signs: Temp: 98.6 F (37 C) (02/10 2351) Temp Source: Oral (02/10 2351) BP: 76/36 (02/11 0100) Pulse Rate: 60 (02/11 0100)  Labs:  Recent Labs  02/02/2017 1400 01/12/2017 1720 01/21/2017 1826 02/01/2017 0035  HGB 12.5* 12.2*  --   --   HCT 38.7* 38.0*  --   --   PLT 112* 101*  --   --   LABPROT 23.0* 25.3*  --   --   INR 2.00 2.26  --   --   HEPARINUNFRC  --   --   --  0.32  CREATININE 4.03*  --  3.61*  --     Estimated Creatinine Clearance: 19.3 mL/min (by C-G formula based on SCr of 3.61 mg/dL (H)).  Assessment: 23 yom with history of aflutter, CVA on warfarin PTA. Pharmacy consulted to dose heparin when INR<2. INR was 2.0 on admission 2/10, last dose of warfarin on 2/9 so heparin started. Heparin level therapeutic (0.32) on gtt at 950 units/hr. No bleeding noted.  Goal of Therapy:  Heparin level 0.3-0.7 units/ml Monitor platelets by anticoagulation protocol: Yes   Plan:  Continue heparin 950 units/hr Will f/u 6hr confirmatory heparin level  Sherlon Handing, PharmD, BCPS Clinical pharmacist, pager [phone] 02/01/2017,1:16 AM

## 2017-02-05 NOTE — Progress Notes (Signed)
Family at the bedside for withdrawal of care, pressors are all off. Wasted 200 ml of fentanyl with Luis Abed.  Desmond Dike RN

## 2017-02-05 NOTE — Progress Notes (Signed)
Per previous discussion with Dr. Nelda Marseille family has decided for comfort care with terminal extubation for Mr. Charvat, message was relayed to Critical Care team. Will await further orders.  Desmond Dike RN

## 2017-02-05 NOTE — Significant Event (Cosign Needed)
1600. The family has requested that once all family members have visited that pressors/ETT/Vent be stopped and goal of care is to be full comfort. We will order a MSO4 drip in conjunction with versed drip for comfort. EEG is being removed. BP (!) 72/59   Pulse 66   Temp 98.7 F (37.1 C) (Oral)   Resp (!) 28   Ht 5\' 5"  (1.651 m)   Wt 145 lb 11.6 oz (66.1 kg)   SpO2 97%   BMI 24.25 kg/m   Recent Labs Lab 01/16/2017 1400 01/23/2017 1826 02/16/2017 0245  NA 136 132* 137  K 5.9* 6.0* 5.0  CL 102 107 104  CO2 23 17* 23  BUN 57* 54* 56*  CREATININE 4.03* 3.61* 3.80*  GLUCOSE 123* 163* 201*    Recent Labs Lab 02/03/2017 1400 01/24/2017 1720 02/16/17 0245  HGB 12.5* 12.2* 12.4*  HCT 38.7* 38.0* 37.7*  WBC 4.3 6.6 9.4  PLT 112* 101* 114*    Ct Angio Head W Or Wo Contrast  Result Date: 01/26/2017 CLINICAL DATA:  Seizures, repeated. Old stroke with right-sided weakness. Cardiomyopathy. EXAM: CT ANGIOGRAPHY HEAD AND NECK TECHNIQUE: Multidetector CT imaging of the head and neck was performed using the standard protocol during bolus administration of intravenous contrast. Multiplanar CT image reconstructions and MIPs were obtained to evaluate the vascular anatomy. Carotid stenosis measurements (when applicable) are obtained utilizing NASCET criteria, using the distal internal carotid diameter as the denominator. CONTRAST:  50 cc Isovue 370 COMPARISON:  Head CT same day FINDINGS: CTA NECK FINDINGS Aortic arch: Atherosclerosis of the arch. No aneurysm or dissection. Markedly dilated main pulmonary artery incidentally noted. Right carotid system: Innominate artery widely patent. Right with slow flow. 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 No antegrade flow seen in the cervical ICA. Left carotid system: Left common carotid artery origin widely patent. Common carotid artery shows slow flow but is patent to the bifurcation. Dense calcified plaque at the carotid bifurcation. Stenosis of the proximal ICA  estimated at 50%. Detail is limited because of artifact from the dense calcification as well as dense venous reflux. Left cervical internal carotid artery is small vessel which shows areas of calcification but does show patency to the skullbase. Vertebral arteries: Right vertebral artery origin shows calcified plaque with stenosis of 30%. Beyond that, the right vertebral artery is patent through the cervical region to the foramen magnum. 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 The vessel is occluded. There is reconstitution of flow in the upper cervical region by cervical collaterals. Left subclavian artery appears chronically occluded, a small vessel with a string of calcifications. Small reconstituted left subclavian artery. Skeleton: Ordinary cervical spondylosis. Other neck: Muscular hemorrhage of the right sternocleidomastoid muscle due to penetration of right IJ central line. Upper chest: Bilateral pleural effusions more extensive on the right than the left with dependent pulmonary collapse/infiltrate. Review of the MIP images confirms the above findings CTA HEAD FINDINGS Anterior circulation: Right internal carotid artery does not show antegrade flow at the skullbase. There is reconstitution in the siphon region presumably from external to internal collaterals. Right supraclinoid ICA is a small vessel that supplies the right middle and anterior cerebral arteries. Dense calcification in the region of the right A1 segment which was present previously, presumably atherosclerotic. Distal vessels not well evaluated because of poor opacification. Left internal carotid artery patent through the skullbase and siphon region. Supraclinoid ICA is patent. Left anterior and middle cerebral arteries are patent. Focal calcification in  the left parietal region presumably relates to an old calcified embolus. Distal vessels show atherosclerotic narrowing and irregularity diffusely. Posterior circulation:  Both vertebral artery show flow at the foramen magnum level with the right being larger than the left. Both vessels are reach the basilar. No basilar stenosis. Small posterior inferior cerebellar arteries are visible. Superior cerebellar and posterior cerebral arteries show flow bilaterally. Venous sinuses: Patent and normal Anatomic variants: None significant Delayed phase: No abnormal enhancement. Old infarctions as previously demonstrated. Review of the MIP images confirms the above findings IMPRESSION: Limited quality study because of very poor vascular opacification. 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 Right anterior and middle cerebral vessels are patent but show distal vessel atherosclerotic irregularity. Dense calcific plaque at the left carotid bifurcation with stenosis of the proximal ICA estimated at 30-50%. Limited detail because of streak artifact from the dense calcification as well as dense venous reflux. Left anterior and middle cerebral vessels are patent but show distal vessel atherosclerotic irregularity. Left subclavian artery occlusion proximally. Small reconstituted left subclavian artery serving the upper extremity. 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 Right vertebral artery shows calcification at the origin with stenosis estimated at 30%. Both vertebral arteries reach the basilar. The basilar is patent. There is flow in both posterior inferior cerebellar arteries, both superior cerebellar arteries and both posterior cerebral arteries. Electronically Signed   By: Nelson Chimes M.D.   On: 01/23/2017 18:45   Ct Head Wo Contrast  Result Date: 01/11/2017 CLINICAL DATA:  Altered  mental status, possible seizure. History of old CVA EXAM: CT HEAD WITHOUT CONTRAST TECHNIQUE: Contiguous axial images were obtained from the base of the skull through the vertex without intravenous contrast. COMPARISON:  06/09/2016 and previous FINDINGS: Brain: Bilateral frontal infarcts left greater than right, stable. Lacunar infarct in the left basal ganglia, stable. There is no evidence of acute intracranial hemorrhage, brain edema, mass lesion, acute infarction, mass effect, or midline shift. Acute infarct may be inapparent on noncontrast CT. No other intra-axial abnormalities are seen. No hydrocephalus. No abnormal extra-axial fluid collections or masses are identified. Vascular: No hyperdense vessel or unexpected calcification.Dx Skull:  No significant calvarial abnormality. Sinuses/Orbits: Mucoperiosteal thickening in the right maxillary sinus. Remainder of paranasal sinuses and mastoid air cells appear normally developed and well aerated. Orbits unremarkable. Other: None. IMPRESSION: 1. Negative for bleed or other acute intracranial process. 2. Stable old bilateral frontal infarcts left greater than right. Electronically Signed   By: Lucrezia Europe M.D.   On: 01/09/2017 14:53   Ct Angio Neck W Or Wo Contrast  Result Date: 01/13/2017 CLINICAL DATA:  Seizures, repeated. Old stroke with right-sided weakness. Cardiomyopathy. EXAM: CT ANGIOGRAPHY HEAD AND NECK TECHNIQUE: Multidetector CT imaging of the head and neck was performed using the standard protocol during bolus administration of intravenous contrast. Multiplanar CT image reconstructions and MIPs were obtained to evaluate the vascular anatomy. Carotid stenosis measurements (when applicable) are obtained utilizing NASCET criteria, using the distal internal carotid diameter as the denominator. CONTRAST:  50 cc Isovue 370 COMPARISON:  Head CT same day FINDINGS: CTA NECK FINDINGS Aortic arch: Atherosclerosis of the arch. No aneurysm or dissection. Markedly  dilated main pulmonary artery incidentally noted. Right carotid system: Innominate  artery widely patent. Right with slow flow. 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 No antegrade flow seen in the cervical ICA. Left carotid system: Left common carotid artery origin widely patent. Common carotid artery shows slow flow but is patent to the bifurcation. Dense calcified plaque at the carotid bifurcation. Stenosis of the proximal ICA estimated at 50%. Detail is limited because of artifact from the dense calcification as well as dense venous reflux. Left cervical internal carotid artery is small vessel which shows areas of calcification but does show patency to the skullbase. Vertebral arteries: Right vertebral artery origin shows calcified plaque with stenosis of 30%. Beyond that, the right vertebral artery is patent through the cervical region to the foramen magnum. 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 The vessel is occluded. There is reconstitution of flow in the upper cervical region by cervical collaterals. Left subclavian artery appears chronically occluded, a small vessel with a string of calcifications. Small reconstituted left subclavian artery. Skeleton: Ordinary cervical spondylosis. Other neck: Muscular hemorrhage of the right sternocleidomastoid muscle due to penetration of right IJ central line. Upper chest: Bilateral pleural effusions more extensive on the right than the left with dependent pulmonary collapse/infiltrate. Review of the MIP images confirms the above findings CTA HEAD FINDINGS Anterior circulation: Right internal carotid artery does not show antegrade flow at the skullbase. There is reconstitution in the siphon region presumably from external to internal collaterals. Right supraclinoid ICA is a small vessel that supplies the right middle and anterior cerebral arteries. Dense calcification in the region of the right A1 segment which was present previously, presumably  atherosclerotic. Distal vessels not well evaluated because of poor opacification. Left internal carotid artery patent through the skullbase and siphon region. Supraclinoid ICA is patent. Left anterior and middle cerebral arteries are patent. Focal calcification in the left parietal region presumably relates to an old calcified embolus. Distal vessels show atherosclerotic narrowing and irregularity diffusely. Posterior circulation: Both vertebral artery show flow at the foramen magnum level with the right being larger than the left. Both vessels are reach the basilar. No basilar stenosis. Small posterior inferior cerebellar arteries are visible. Superior cerebellar and posterior cerebral arteries show flow bilaterally. Venous sinuses: Patent and normal Anatomic variants: None significant Delayed phase: No abnormal enhancement. Old infarctions as previously demonstrated. Review of the MIP images confirms the above findings IMPRESSION: Limited quality study because of very poor vascular opacification. 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 Right anterior and middle cerebral vessels are patent but show distal vessel atherosclerotic irregularity. Dense calcific plaque at the left carotid bifurcation with stenosis of the proximal ICA estimated at 30-50%. Limited detail because of streak artifact from the dense calcification as well as dense venous reflux. Left anterior and middle cerebral vessels are patent but show distal vessel atherosclerotic irregularity. Left subclavian artery occlusion proximally. Small reconstituted left subclavian artery serving the upper extremity. 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  Right vertebral artery shows calcification at the origin with stenosis estimated at 30%. Both vertebral arteries reach the basilar. The basilar is patent. There is flow in both posterior inferior cerebellar arteries, both superior cerebellar arteries and both posterior cerebral arteries. Electronically Signed   By: Elta Guadeloupe  Shogry M.D.   On: 01/29/2017 18:45   Dg Chest Portable 1 View  Result Date: 01/15/2017 CLINICAL DATA:  Central line placement. EXAM: PORTABLE CHEST 1 VIEW COMPARISON:  01/25/2017 FINDINGS: Endotracheal tube and NG tube unchanged. Introduction of RIGHT central venous line with tip in distal SVC. No pneumothorax. Small RIGHT effusion and airspace disease not changed. IMPRESSION: 1. 1. RIGHT central venous line placed without pneumothorax. 2. Lateral RIGHT effusion and lower lobe airspace disease. Electronically Signed   By: Suzy Bouchard M.D.   On: 01/25/2017 16:23   Dg Chest Portable 1 View  Result Date: 01/10/2017 CLINICAL DATA:  Status post intubation EXAM: PORTABLE CHEST 1 VIEW COMPARISON:  Film from earlier in the same day FINDINGS: Cardiac shadow is stable. Defibrillator is again noted in stable position. Endotracheal tube is now seen 5.4 cm above the carina in satisfactory position. A nasogastric catheter is noted within the stomach. Persistent bilateral infiltrates are again seen. IMPRESSION: Endotracheal tube and nasogastric catheter in satisfactory position. Stable bilateral infiltrates Electronically Signed   By: Inez Catalina M.D.   On: 01/22/2017 14:34   Dg Chest Port 1 View  Result Date: 02/04/2017 CLINICAL DATA:  Pt had a seizure this morning and was c/o nausea prior to the seizure. Pt has AMS. EXAM: PORTABLE CHEST - 1 VIEW COMPARISON:  12/12/2016 FINDINGS: Mild cardiomegaly . Bilateral subclavian Venous AICD leads extend through intracardiac stent into left coronary sinus, left atrium and left ventricle as before in this patient with a history of transposition of great  vessels. Stable epicardial lead. Heart size and mediastinal contours are within normal limits. Widening of the superior mediastinum is emphasized by portable technique. Worsening perihilar airspace opacities bilaterally. Right pleural effusion appears slightly larger than on previous exam. No pneumothorax. IMPRESSION: 1. Worsening bilateral infiltrates or edema. 2. Suspect slight increase in right pleural effusion. Electronically Signed   By: Lucrezia Europe M.D.   On: 01/10/2017 13:59   I/P change goal of care from curative to comfort. Orders placed. Await family.   Richardson Landry Minor ACNP Mary[facility] Shape PCCM Pager [phone] till 3 pm If no answer page [phone] 02-11-17, 3:59 PM

## 2017-02-05 NOTE — Progress Notes (Signed)
Pharmacy Antibiotic Note  KETRICK PRZYWARA is a 56 y.o. male admitted on 01/29/2017 with r/o meningitis.  Pharmacy has been consulted for ampicillin and vancomycin dosing. Pt with Tmax 101 and WBC is WNL. Scr continues to increase to 3.8. Lactic acid is 2.   Plan: Continue ampicillin as ordered Change vancomycin to 1gm IV Q48H - may need to dose based on levels F/u renal fxn, C&S, clinical status and trough at SS  Height: 5\' 5"  (165.1 cm) Weight: 145 lb 11.6 oz (66.1 kg) IBW/kg (Calculated) : 61.5  Temp (24hrs), Avg:98.7 F (37.1 C), Min:96 F (35.6 C), Max:101 F (38.3 C)   Recent Labs Lab 01/08/2017 1400  02/01/2017 1720 01/19/2017 1743 01/29/2017 1826 01/17/17 2020 2017-02-10 0245 2017-02-10 0514 02/10/2017 0823  WBC 4.3  --  6.6  --   --   --  9.4  --   --   CREATININE 4.03*  --   --   --  3.61*  --  3.80*  --   --   LATICACIDVEN  --   < > 2.0* 2.16*  --  1.8  --  1.9 2.0*  < > = values in this interval not displayed.  Estimated Creatinine Clearance: 19.1 mL/min (by C-G formula based on SCr of 3.8 mg/dL (H)).    Allergies  Allergen Reactions  . Zithromax [Azithromycin] Other (See Comments)    Other Reaction: AFFECTS HR Interacts with heart medicine    Antimicrobials this admission: Vanc 2/10>> Amp 2/10>> CTX 2/10>>  Dose adjustments this admission: N/A  Microbiology results: 2/10 MRSA - NEG  Thank you for allowing pharmacy to be a part of this patient's care.  Cloyce Paterson, Rande Lawman 10-Feb-2017 9:34 AM

## 2017-02-05 NOTE — Progress Notes (Signed)
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 Transported to ED and was noted to be stiffene  and was intubated. PCCM called to bedside for further evaluation. Unknown cause of szs and neuro was consulted. He is a LCB intubation and pressors. E will treat with abx and further workups.   SUBJECTIVE:  Critically ill  VITAL SIGNS: BP (!) 38/19 Comment: see aline  Pulse 66   Temp 98.7 F (37.1 C) (Oral)   Resp (!) 28   Ht 5\' 5"  (1.651 m)   Wt 145 lb 11.6 oz (66.1 kg)   SpO2 95%   BMI 24.25 kg/m   HEMODYNAMICS: CVP:  [15 mmHg-32 mmHg] 15 mmHg  VENTILATOR SETTINGS: Vent Mode: PRVC FiO2 (%):  [60 %-100 %] 60 % Set Rate:  [15 bmp-28 bmp] 28 bmp Vt Set:  [500 mL] 500 mL PEEP:  [5 cmH20-12 cmH20] 5 cmH20 Plateau Pressure:  [12 cmH20-36 cmH20] 27 cmH20  INTAKE / OUTPUT: I/O last 3 completed shifts: In: 1946.1 [I.V.:1846.1; IV Piggyback:100] Out: 105 [Urine:105]  PHYSICAL EXAMINATION: General: Thin wm color improved Neuro: Less  Rigid , old contractures rt arm HEENT:  ET/OGT Cardiovascular: HSD Lungs:  Coarse rhonchi bilat Abdomen:  rigid Musculoskeletal:  rigid Skin:  warm  LABS:  BMET  Recent Labs Lab 02/03/2017 1400 01/22/2017 1826 01/28/2017 0245  NA 136 132* 137  K 5.9* 6.0* 5.0  CL 102 107 104  CO2 23 17* 23  BUN 57* 54* 56*  CREATININE 4.03* 3.61* 3.80*  GLUCOSE 123* 163* 201*    Electrolytes  Recent Labs Lab 01/30/2017 1400 02/04/2017 1826 2017/01/28 0245  CALCIUM 8.9 7.2* 7.2*  MG  --  2.1 2.1  PHOS  --  4.0 3.4    CBC  Recent Labs Lab 01/16/2017 1400 01/23/2017 1720 2017-01-28 0245  WBC 4.3 6.6 9.4  HGB 12.5* 12.2* 12.4*  HCT 38.7* 38.0* 37.7*  PLT 112* 101*  114*    Coag's  Recent Labs Lab 01/29/2017 1400 01/24/2017 1720 2017/01/28 0823  INR 2.00 2.26 3.18    Sepsis Markers  Recent Labs Lab 01/20/2017 1826 01/11/2017 2020 01/28/17 0514 2017-01-28 0823  LATICACIDVEN  --  1.8 1.9 2.0*  PROCALCITON 0.14  --   --   --     ABG  Recent Labs Lab 02/01/2017 1534 01/22/2017 2039 01/28/2017 0253  PHART 7.269* 7.296* 7.447  PCO2ART 42.1 30.0* 30.1*  PO2ART 135.0* 146.0* 110.0*    Liver Enzymes  Recent Labs Lab 01/25/2017 1400 01/13/2017 1826  AST 35 42*  ALT 21 29  ALKPHOS 81 79  BILITOT 1.1 1.1  ALBUMIN 3.3* 2.7*    Cardiac Enzymes No results for input(s): TROPONINI, PROBNP in the last 168 hours.  Glucose  Recent Labs Lab Jan 28, 2017 0305 01-28-2017 0413 01-28-2017 0739 2017/01/28 1118  GLUCAP 170* 166* 135* 155*    Imaging Ct Angio Head W Or Wo Contrast  Result Date: 01/27/2017 CLINICAL DATA:  Seizures, repeated. Old stroke with right-sided weakness. Cardiomyopathy. EXAM: CT ANGIOGRAPHY HEAD AND NECK TECHNIQUE: Multidetector CT imaging of the head and neck was performed using the standard protocol during bolus administration of intravenous contrast. Multiplanar CT image reconstructions and MIPs were  obtained to evaluate the vascular anatomy. Carotid stenosis measurements (when applicable) are obtained utilizing NASCET criteria, using the distal internal carotid diameter as the denominator. CONTRAST:  50 cc Isovue 370 COMPARISON:  Head CT same day FINDINGS: CTA NECK FINDINGS Aortic arch: Atherosclerosis of the arch. No aneurysm or dissection. Markedly dilated main pulmonary artery incidentally noted. Right carotid system: Innominate artery widely patent. Right with slow flow. 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 No antegrade flow seen in the cervical ICA. Left carotid system: Left common carotid artery origin widely patent. Common carotid artery shows slow flow but is patent to the bifurcation. Dense calcified plaque at the carotid bifurcation.  Stenosis of the proximal ICA estimated at 50%. Detail is limited because of artifact from the dense calcification as well as dense venous reflux. Left cervical internal carotid artery is small vessel which shows areas of calcification but does show patency to the skullbase. Vertebral arteries: Right vertebral artery origin shows calcified plaque with stenosis of 30%. Beyond that, the right vertebral artery is patent through the cervical region to the foramen magnum. 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 The vessel is occluded. There is reconstitution of flow in the upper cervical region by cervical collaterals. Left subclavian artery appears chronically occluded, a small vessel with a string of calcifications. Small reconstituted left subclavian artery. Skeleton: Ordinary cervical spondylosis. Other neck: Muscular hemorrhage of the right sternocleidomastoid muscle due to penetration of right IJ central line. Upper chest: Bilateral pleural effusions more extensive on the right than the left with dependent pulmonary collapse/infiltrate. Review of the MIP images confirms the above findings CTA HEAD FINDINGS Anterior circulation: Right internal carotid artery does not show antegrade flow at the skullbase. There is reconstitution in the siphon region presumably from external to internal collaterals. Right supraclinoid ICA is a small vessel that supplies the right middle and anterior cerebral arteries. Dense calcification in the region of the right A1 segment which was present previously, presumably atherosclerotic. Distal vessels not well evaluated because of poor opacification. Left internal carotid artery patent through the skullbase and siphon region. Supraclinoid ICA is patent. Left anterior and middle cerebral arteries are patent. Focal calcification in the left parietal region presumably relates to an old calcified embolus. Distal vessels show atherosclerotic narrowing and irregularity  diffusely. Posterior circulation: Both vertebral artery show flow at the foramen magnum level with the right being larger than the left. Both vessels are reach the basilar. No basilar stenosis. Small posterior inferior cerebellar arteries are visible. Superior cerebellar and posterior cerebral arteries show flow bilaterally. Venous sinuses: Patent and normal Anatomic variants: None significant Delayed phase: No abnormal enhancement. Old infarctions as previously demonstrated. Review of the MIP images confirms the above findings IMPRESSION: Limited quality study because of very poor vascular opacification. 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 Right anterior and middle cerebral vessels are patent but show distal vessel atherosclerotic irregularity. Dense calcific plaque at the left carotid bifurcation with stenosis of the proximal ICA estimated at 30-50%. Limited detail because of streak artifact from the dense calcification as well as dense venous reflux. Left anterior and middle cerebral vessels are patent but show distal vessel atherosclerotic irregularity. Left subclavian artery occlusion proximally. Small reconstituted left subclavian artery serving the upper extremity. 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 Right vertebral artery shows calcification at the origin with stenosis estimated at 30%. Both vertebral arteries reach the basilar. The basilar is patent. There is flow in both posterior inferior cerebellar arteries, both superior cerebellar arteries and both posterior cerebral arteries. Electronically Signed   By: Nelson Chimes M.D.   On: 01/29/2017 18:45   Ct Head Wo Contrast  Result Date:  01/28/2017 CLINICAL DATA:  Altered mental status, possible seizure. History of old CVA EXAM: CT HEAD WITHOUT CONTRAST TECHNIQUE: Contiguous axial images were obtained from the base of the skull through the vertex without intravenous contrast. COMPARISON:  06/09/2016 and previous FINDINGS: Brain: Bilateral frontal infarcts left greater than right, stable. Lacunar infarct in the left basal ganglia, stable. There is no evidence of acute intracranial hemorrhage, brain edema, mass lesion, acute infarction, mass effect, or midline shift. Acute infarct may be inapparent on noncontrast CT. No other intra-axial abnormalities are seen. No hydrocephalus. No abnormal extra-axial fluid collections or masses are identified. Vascular: No hyperdense vessel or unexpected calcification.Dx Skull:  No significant calvarial abnormality. Sinuses/Orbits: Mucoperiosteal thickening in the right maxillary sinus. Remainder of paranasal sinuses and mastoid air cells appear normally developed and well aerated. Orbits unremarkable. Other: None. IMPRESSION: 1. Negative for bleed or other acute intracranial process. 2. Stable old bilateral frontal infarcts left greater than right. Electronically Signed   By: Lucrezia Europe M.D.   On: 01/27/2017 14:53   Ct Angio Neck W Or Wo Contrast  Result Date: 01/27/2017 CLINICAL DATA:  Seizures, repeated. Old stroke with right-sided weakness. Cardiomyopathy. EXAM: CT ANGIOGRAPHY HEAD AND NECK TECHNIQUE: Multidetector CT imaging of the head and neck was performed using the standard protocol during bolus administration of intravenous contrast. Multiplanar CT image reconstructions and MIPs were obtained to evaluate the vascular anatomy. Carotid stenosis measurements (when applicable) are obtained utilizing NASCET criteria, using the distal internal carotid diameter as the denominator. CONTRAST:  50 cc Isovue 370 COMPARISON:  Head CT same day FINDINGS: CTA NECK FINDINGS Aortic arch: Atherosclerosis of the arch. No  aneurysm or dissection. Markedly dilated main pulmonary artery incidentally noted. Right carotid system: Innominate artery widely patent. Right with slow flow. 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 No antegrade flow seen in the cervical ICA. Left carotid system: Left common carotid artery origin widely patent. Common carotid artery shows slow flow but is patent to the bifurcation. Dense calcified plaque at the carotid bifurcation. Stenosis of the proximal ICA estimated at 50%. Detail is limited because of artifact from the dense calcification as well as dense venous reflux. Left cervical internal carotid artery is small vessel which shows areas of calcification but does show patency to the skullbase. Vertebral arteries: Right vertebral artery origin shows calcified plaque with stenosis of 30%. Beyond that, the right vertebral artery is patent through the cervical region to the foramen magnum. 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 The vessel is occluded. There is reconstitution of flow in the upper cervical region by cervical collaterals. Left subclavian artery appears chronically occluded, a small vessel with a string of calcifications. Small reconstituted left subclavian artery. Skeleton: Ordinary cervical spondylosis. Other neck: Muscular hemorrhage of the right sternocleidomastoid muscle due to penetration of right IJ central line. Upper chest: Bilateral pleural effusions more extensive on the right than the left with dependent pulmonary collapse/infiltrate. Review of the MIP images confirms the above findings CTA HEAD FINDINGS Anterior circulation: Right internal carotid  artery does not show antegrade flow at the skullbase. There is reconstitution in the siphon region presumably from external to internal collaterals. Right supraclinoid ICA is a small vessel that supplies the right middle and anterior cerebral arteries. Dense calcification in the region of the right A1 segment which was  present previously, presumably atherosclerotic. Distal vessels not well evaluated because of poor opacification. Left internal carotid artery patent through the skullbase and siphon region. Supraclinoid ICA is patent. Left anterior and middle cerebral arteries are patent. Focal calcification in the left parietal region presumably relates to an old calcified embolus. Distal vessels show atherosclerotic narrowing and irregularity diffusely. Posterior circulation: Both vertebral artery show flow at the foramen magnum level with the right being larger than the left. Both vessels are reach the basilar. No basilar stenosis. Small posterior inferior cerebellar arteries are visible. Superior cerebellar and posterior cerebral arteries show flow bilaterally. Venous sinuses: Patent and normal Anatomic variants: None significant Delayed phase: No abnormal enhancement. Old infarctions as previously demonstrated. Review of the MIP images confirms the above findings IMPRESSION: Limited quality study because of very poor vascular opacification. 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 Right anterior and middle cerebral vessels are patent but show distal vessel atherosclerotic irregularity. Dense calcific plaque at the left carotid bifurcation with stenosis of the proximal ICA estimated at 30-50%. Limited detail because of streak artifact from the dense calcification as well as dense venous reflux. Left anterior and middle cerebral vessels are patent but show distal vessel atherosclerotic irregularity. Left subclavian artery occlusion proximally. Small reconstituted left subclavian artery serving the upper extremity. 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 Right vertebral artery shows calcification at the origin with stenosis estimated at 30%. Both vertebral arteries reach the basilar. The basilar is patent. There is flow in both posterior inferior cerebellar arteries, both superior cerebellar arteries and both posterior cerebral arteries. Electronically Signed   By: Nelson Chimes M.D.   On: 01/28/2017 18:45   Dg Chest Portable 1 View  Result Date: 01/09/2017 CLINICAL DATA:  Central line placement. EXAM: PORTABLE CHEST 1 VIEW COMPARISON:  01/26/2017 FINDINGS: Endotracheal tube and NG tube unchanged. Introduction of RIGHT central venous line with tip in distal SVC. No pneumothorax. Small RIGHT effusion and airspace disease not changed. IMPRESSION: 1. 1. RIGHT central venous line placed without pneumothorax. 2. Lateral RIGHT effusion and lower lobe airspace disease. Electronically Signed   By: Suzy Bouchard M.D.   On: 01/28/2017 16:23   Dg Chest Portable 1 View  Result Date: 01/31/2017 CLINICAL DATA:  Status post intubation EXAM: PORTABLE CHEST 1 VIEW COMPARISON:  Film from earlier in the same day FINDINGS: Cardiac shadow is stable. Defibrillator is again noted in stable position. Endotracheal tube is now seen 5.4 cm above the carina in satisfactory position. A nasogastric catheter is noted within the stomach. Persistent bilateral infiltrates are again seen. IMPRESSION: Endotracheal tube and nasogastric catheter in satisfactory position. Stable bilateral infiltrates Electronically Signed   By: Inez Catalina M.D.   On: 01/22/2017 14:34   Dg Chest Port 1 View  Result Date: 01/16/2017 CLINICAL DATA:  Pt had a seizure this morning and was c/o nausea prior to the seizure. Pt has AMS. EXAM: PORTABLE CHEST -  1 VIEW COMPARISON:  12/12/2016 FINDINGS: Mild cardiomegaly . Bilateral subclavian Venous AICD leads extend through intracardiac stent into left coronary sinus, left atrium and left ventricle as before in this patient with a  history of transposition of great vessels. Stable epicardial lead. Heart size and mediastinal contours are within normal limits. Widening of the superior mediastinum is emphasized by portable technique. Worsening perihilar airspace opacities bilaterally. Right pleural effusion appears slightly larger than on previous exam. No pneumothorax. IMPRESSION: 1. Worsening bilateral infiltrates or edema. 2. Suspect slight increase in right pleural effusion. Electronically Signed   By: Lucrezia Europe M.D.   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 Transported to ED and was noted to be stiffene  and was intubated. PCCM called to bedside for further evaluation. Unknown cause of szs and neuro was consulted. He is a LCB intubation and pressors. E will treat with abx and further workups.  ASSESSMENT / PLAN:  PULMONARY A: VDRF secondary to inability to protect airway P:   Vent bundle  CARDIOVASCULAR A:  Shock from unknown origin Hx of great vessel transpostion and repair as a child AICD in place Possible infection P:  Pressor support Continue to monitor HD  RENAL Lab Results  Component Value Date   CREATININE 3.80 (H) 01/29/2017   CREATININE 3.61 (H) 01/28/2017   CREATININE 4.03 (H) 01/24/2017    Recent Labs Lab 01/20/2017 1400 01/21/2017 1826 29-Jan-2017 0245  K 5.9* 6.0* 5.0     A:   Acute renal failure Hyperkalemia. Suspect from sz activity, resolved Lactic acidosis. Suspect from sz activity P:   Hydration  Monitor lytes  GASTROINTESTINAL A:   GI protection OGT P:   PPI OGT ->LWIS  HEMATOLOGIC A:   Chronic coumadin with extensive heart surgery, hx stroke P:   INr 2.0 Hold off on anticoagulation for now  INFECTIOUS A:   Treated empirically for possible meningitis with Ampicillin , roc, vanc P:   Monitor temp and wbc Pan culture  ENDOCRINE CBG (last 3)   Recent Labs  2017-01-29 0413 01-29-2017 0739 01/29/17 1118  GLUCAP 166* 135* 155*     A:   No acute issues   P:   Follow glucose on labs  NEUROLOGIC A:   Hx of strokes New onset of syncopal episode 2/10 2/10 new Szs and rigidity  P:   RASS goal: 0 Ativan to break szs and rigidity   Neuro consulted 2/10 Keppra per neuro   FAMILY  - Updates: Family up dated at bedside per Dr. 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  The patient is requiring 4 pressors (dobutamine, vaso, levo and neo) with drifting down BP.  Renal function is also very poor at this point with low UOP.  Seizure activity seems to have stopped and patient is moving spontaneously on exam but not to command, not  awake.  I reviewed CXR myself, ETT ok.  Spoke with father, mother and son.  Informed them that the primary problem here is hemodynamics and that we have no further room to increase pressors.  Father and mother are ok with d/c of pressors but son needs more time.  Will make DNR right now, no increase in pressors and will come back and visit with family after they get together and discuss further.  The patient is critically ill with multiple organ systems failure and requires high complexity decision making for assessment and support, frequent evaluation and titration of therapies, application of advanced monitoring technologies and extensive interpretation of multiple databases.   Critical Care Time devoted to  patient care services described in this note is  35  Minutes. This time reflects time of care of this signee Dr [name]. This critical care time does not reflect procedure time, or teaching time or supervisory time of PA/NP/Med student/Med Resident etc but could involve care discussion time.  Rush Farmer, M.D. [facility] Pulmonary/Critical Care Medicine. Pager: [phone]. After hours pager: [phone].

## 2017-02-05 NOTE — Progress Notes (Signed)
Pt. Extubated pressors off family at the bedside.  Desmond Dike RN

## 2017-02-05 NOTE — Procedures (Signed)
LTM-EEG Report  HISTORY: Continuous video-EEG monitoring performed for 56 year old with unresponsiveness, altered mental status, possible seizures. ACQUISITION: International 10-20 system for electrode placement; 18 channels with additional eyes linked to ipsilateral ears and EKG. Additional T1-T2 electrodes were used. Continuous video recording obtained.   EEG NUMBER:  MEDICATIONS:  Day 1: LEV, MDZ, propofol  DAY #1: from 1910 02/03/2017 to 0730 February 11, 2017  BACKGROUND: An overall low to medium voltage continuous recording with poor spontaneous variability and no clear reactivity. The background activity initially consisted of low voltage irregular 1-2hz  activity diffusely with no superimposed faster frequencies. Over the course of the recording, the record became slightly higher voltage, and some transient 3-5hz  activity was also seen, intermixed with the background. State changes were no appreciated, and no clear reactivity was seen with stimulation.   EPILEPTIFORM/PERIODIC ACTIVITY: none SEIZURES: none EVENTS: none EKG: There was an irregular rhythm and abnormal depolarizations in the EKG channel.   SUMMARY: This was markedly abnormal continuous video EEG due to loss of normal physiological rhythms and diffuse slow activity. There were no epileptiform discharges or seizures. There was no clear evidence of reactivity.  There was an abnormality in the EKG channel, described above.

## 2017-02-05 NOTE — Progress Notes (Signed)
TOD at 2020. Pulseless and absence of respirations confirmed by Shelda Pal

## 2017-02-05 NOTE — Progress Notes (Signed)
Patient terminally extubated to room air per MD order. Family and RN at bedside. No complications. RT will continue to monitor. 

## 2017-02-05 NOTE — Progress Notes (Signed)
Pt family and friends at bedside, comfort care ongoing, morphine and versed infusing, chaplin offered but family deferred explaining members of the family have a spiritual background, nursing staff will continue to provide emotional support.

## 2017-02-05 DEATH — deceased

## 2017-05-27 ENCOUNTER — Ambulatory Visit: Payer: Medicare Other | Admitting: Oncology

## 2017-05-27 ENCOUNTER — Other Ambulatory Visit: Payer: Medicare Other

## 2018-01-28 IMAGING — CR DG CHEST 2V
2 series · 2 of 2 positions shown · non-contrast
Comparison: PA and lateral chest 12/19/2014, 05/26/2012 and
07/14/2010. CT chest 07/14/2010.

CLINICAL DATA: Status post trip and fall today with a blow to the
chest. Initial encounter.

EXAM:
CHEST  2 VIEW

[chest pa]
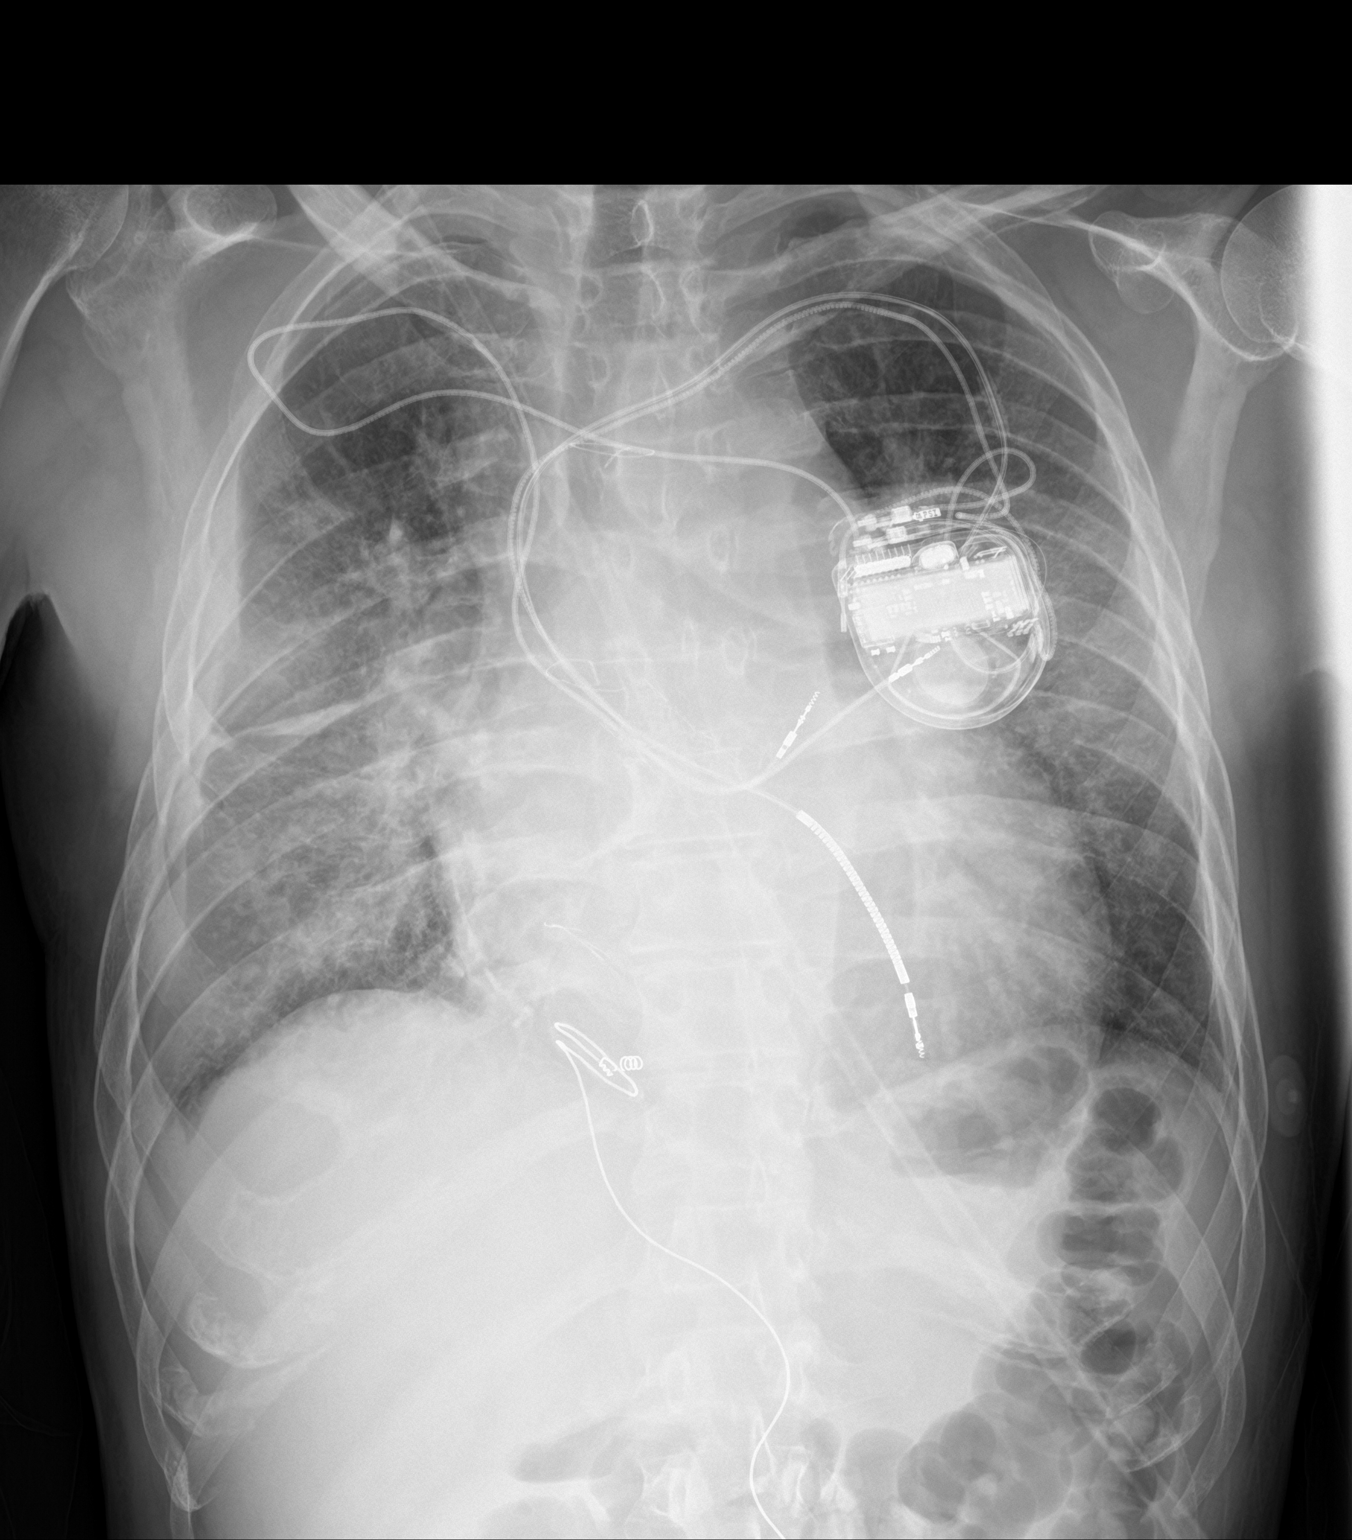

[chest lat]
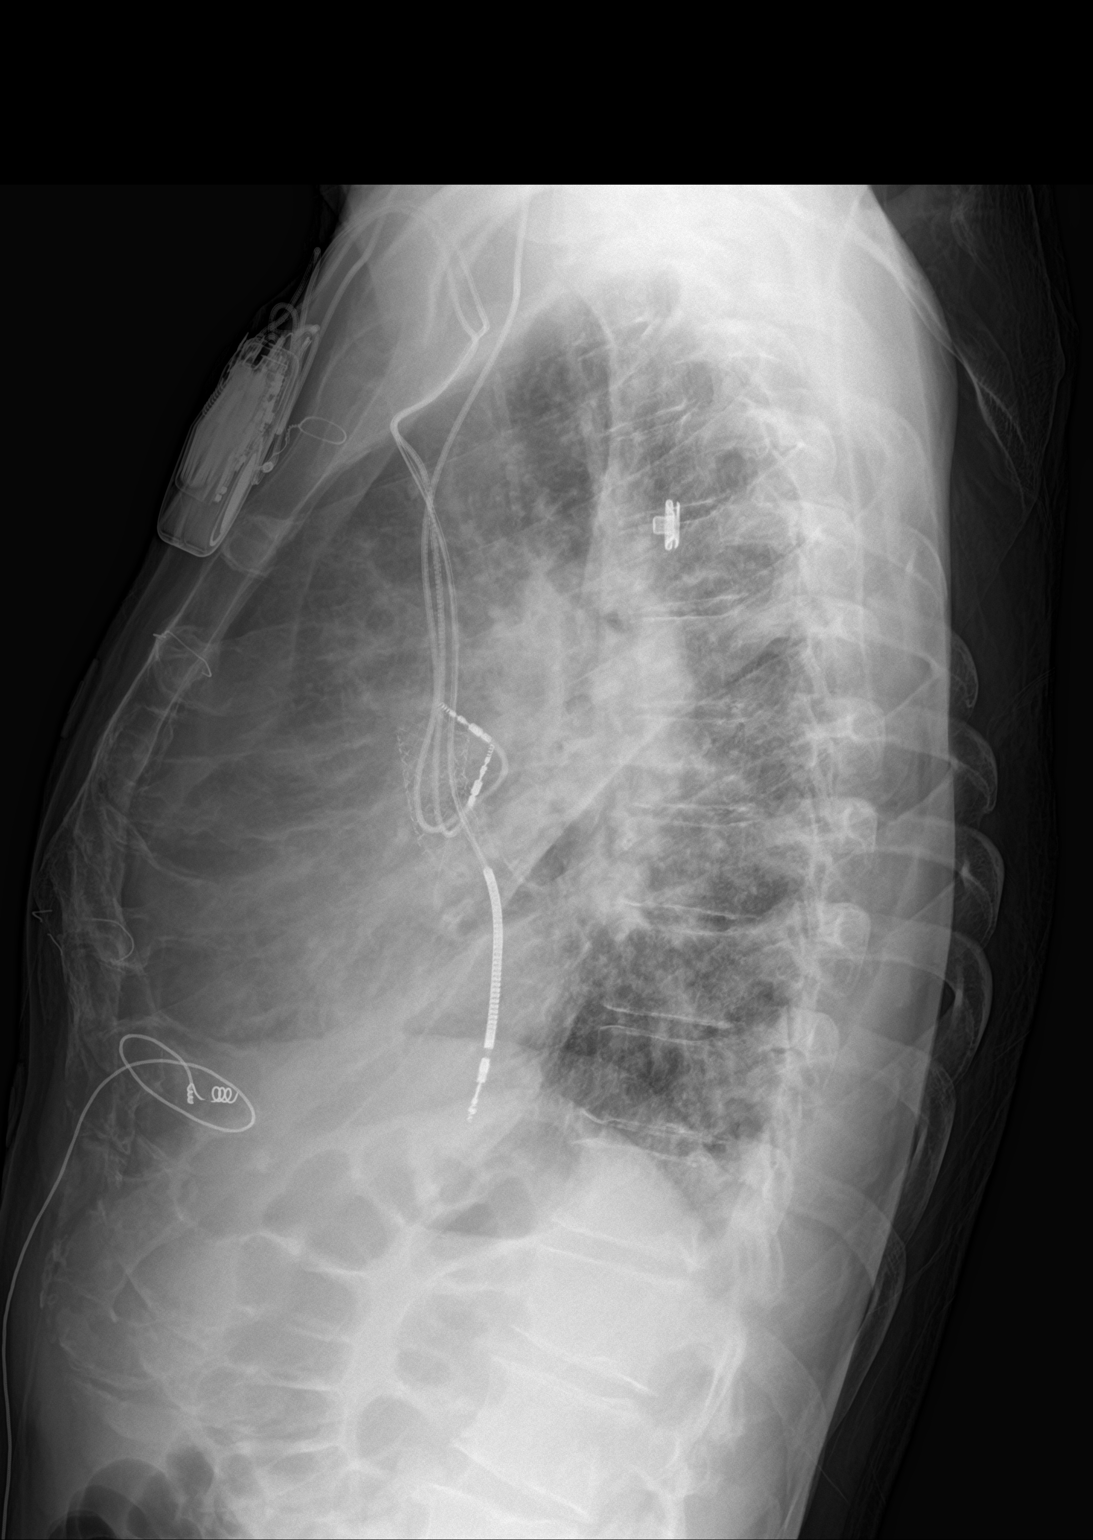

[2 of 2 positions shown; findings below may reference images not displayed]

FINDINGS: There is cardiomegaly. Pacing device is unchanged. A chronic small
right pleural effusion is also unchanged. Hazy bilateral pulmonary
opacities appear slightly worse than on the most recent examination
consistent with some increase in chronic pulmonary edema. No
pneumothorax is identified. No focal bony abnormality is seen.
IMPRESSION: Negative for evidence of trauma.

Chronic pulmonary edema appears somewhat worst than on the most
recent examination.

No change in cardiomegaly and a small, chronic right pleural
effusion.

## 2018-02-05 IMAGING — CR DG CHEST 2V
2 series · 2 of 2 positions shown · non-contrast
Comparison: Chest x-ray of March 25, 2016 and December 19, 2014.

CLINICAL DATA: Forty-five days of nonproductive cough ; history of
CHF and coronary artery disease.

EXAM:
CHEST  2 VIEW

[w chest pa]
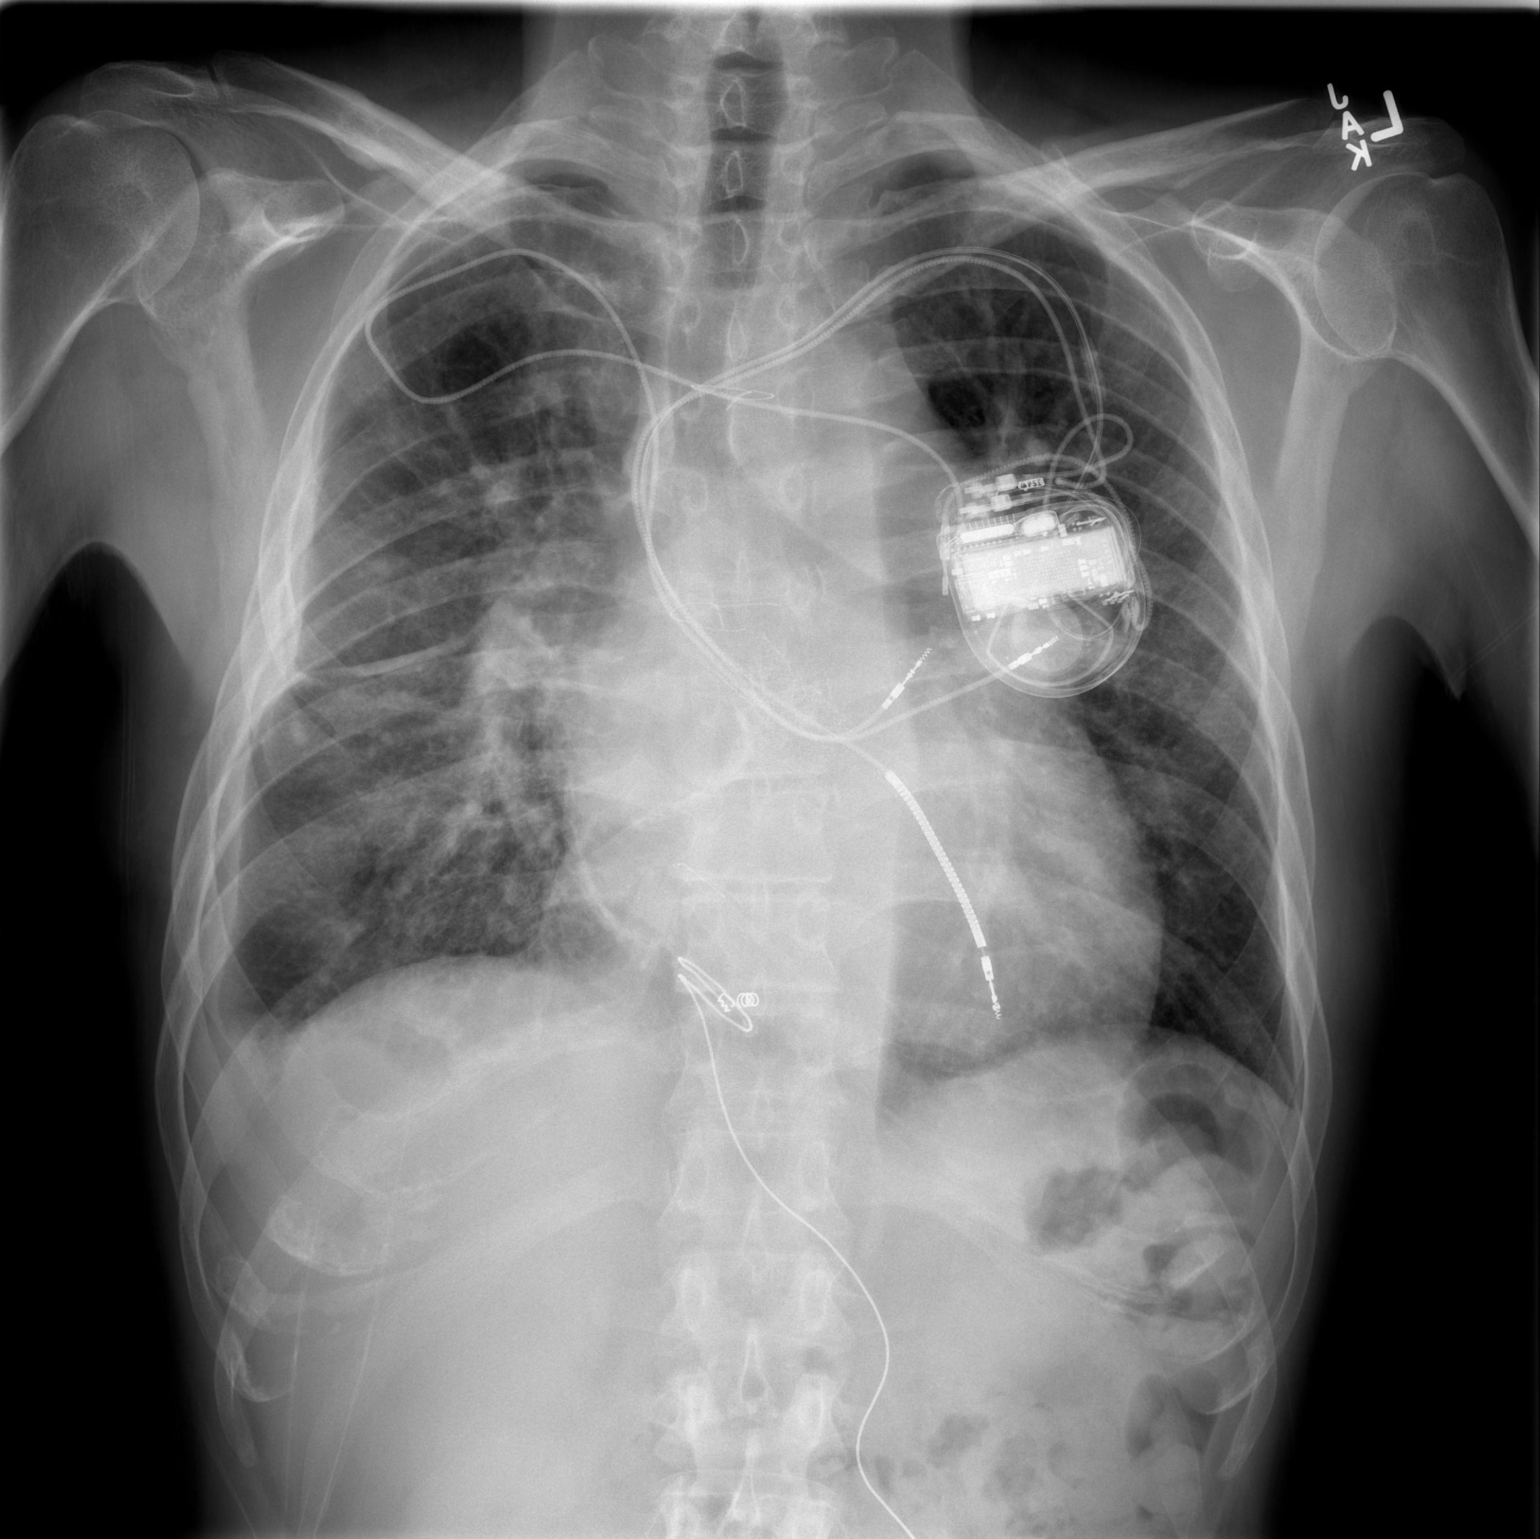

[w chest lat]
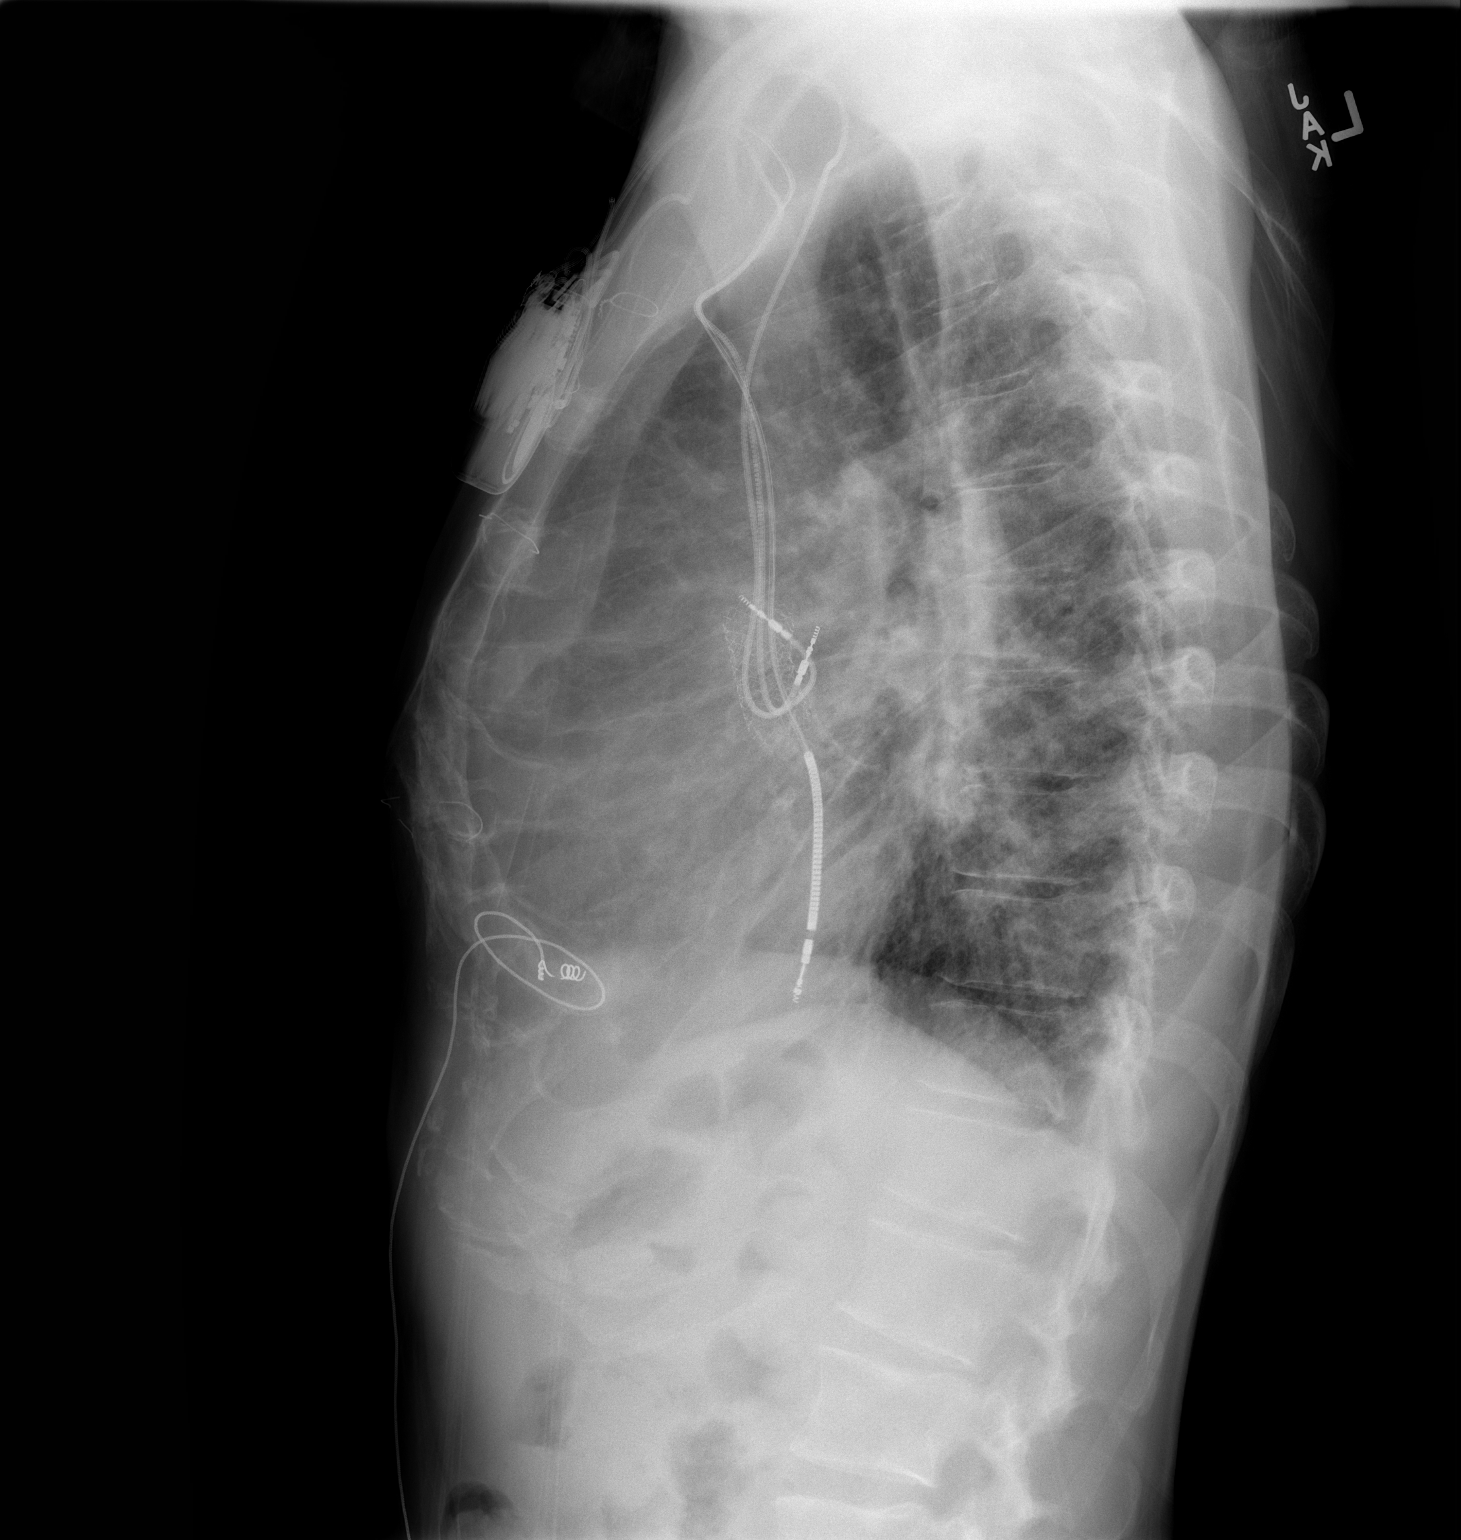

[2 of 2 positions shown; findings below may reference images not displayed]

FINDINGS: The lungs are well-expanded. The interstitial markings remain
increased but have improved. There is a small right pleural effusion
which has decreased in volume. The cardiac silhouette remains mildly
enlarged. The pulmonary vascularity remains engorged but is more
distinct. The permanent pacemaker defibrillator is in stable
position. The bony thorax exhibits no acute abnormality.
IMPRESSION: CHF. Overall mild improvement since the March 25, 2016 study with
decreased interstitial edema and slight decreased volume of right
pleural fluid.

## 2018-10-17 IMAGING — CR DG CHEST 2V
2 series · 2 of 2 positions shown · non-contrast
Comparison: 04/02/2016

CLINICAL DATA: Cough, congestion

EXAM:
CHEST  2 VIEW

[w chest pa]
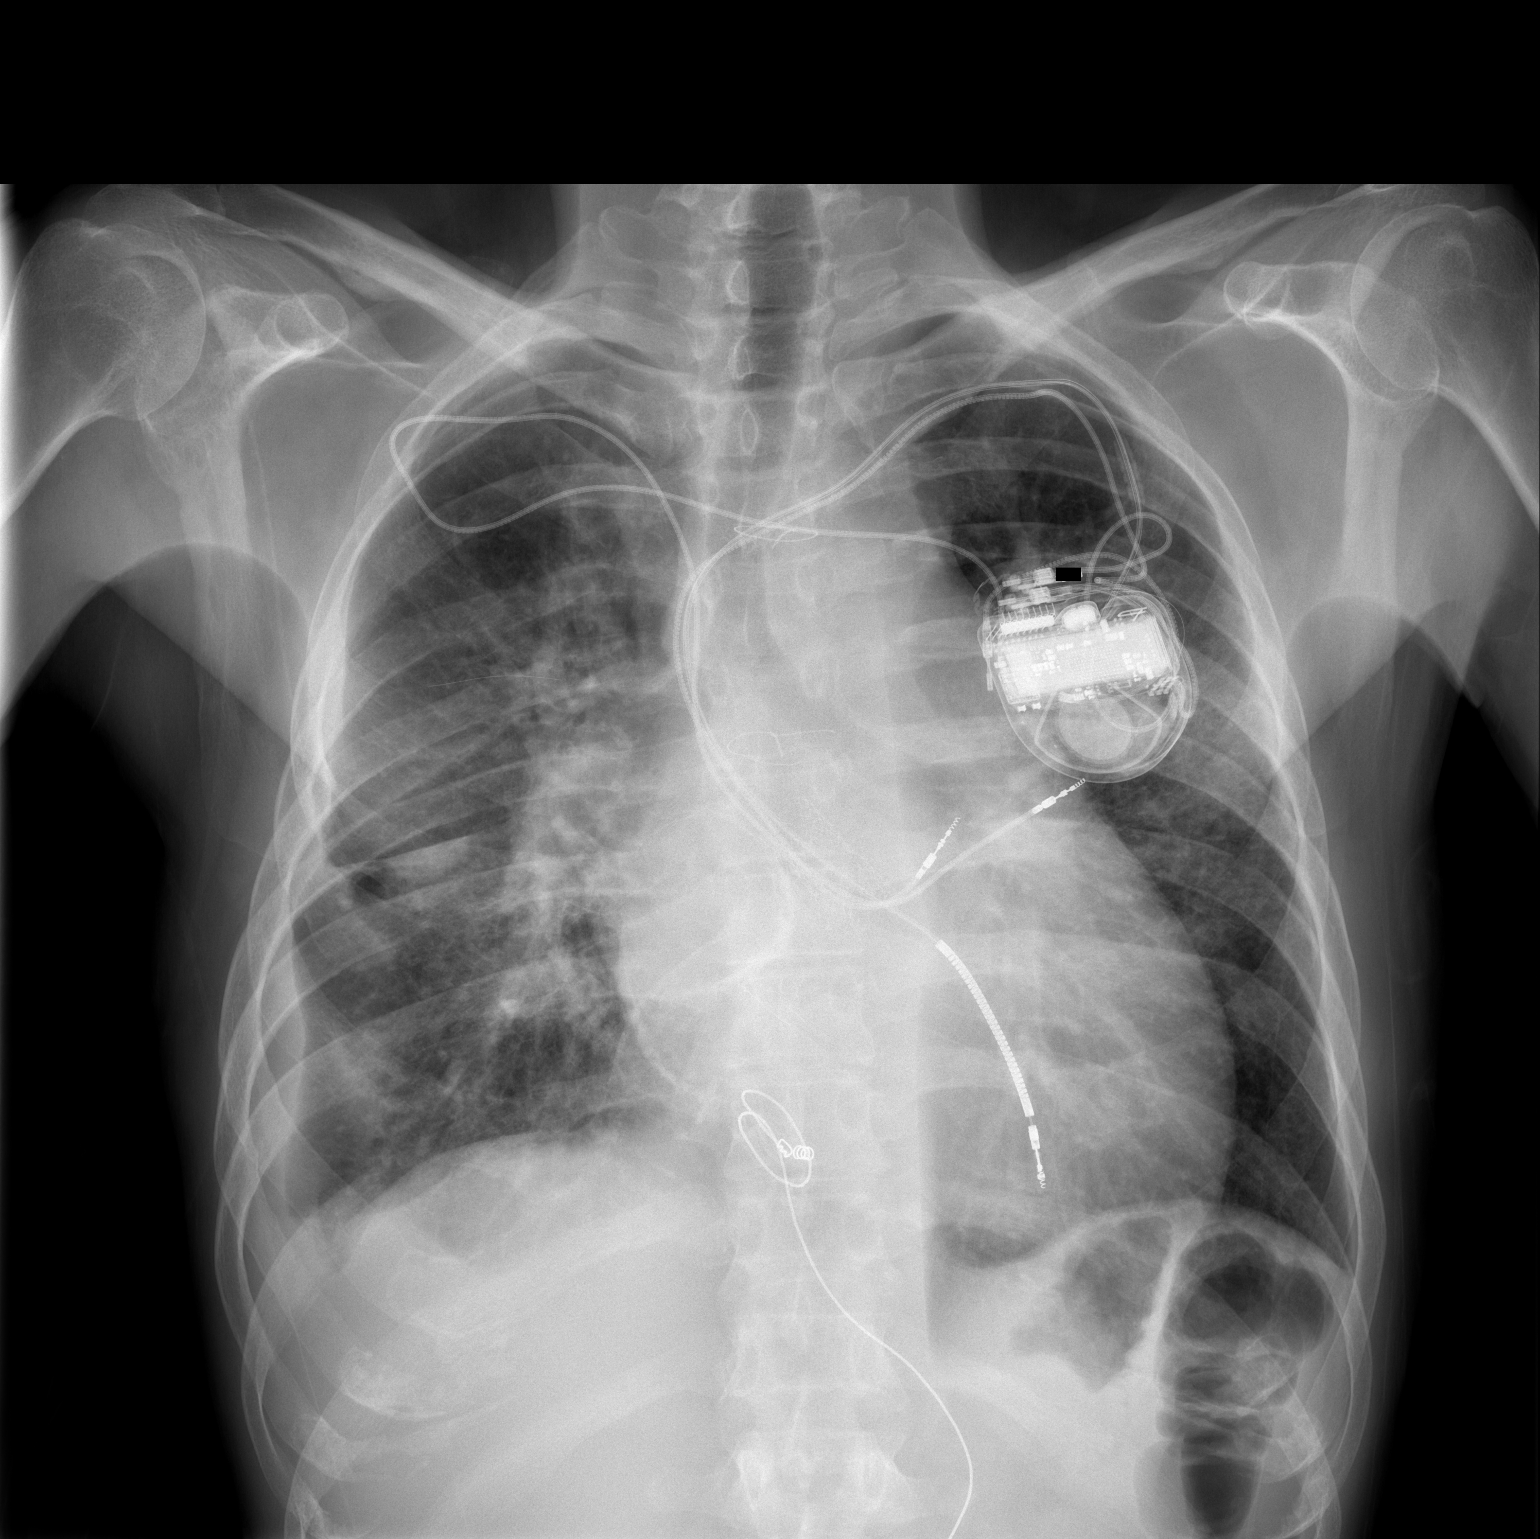

[w chest lat]
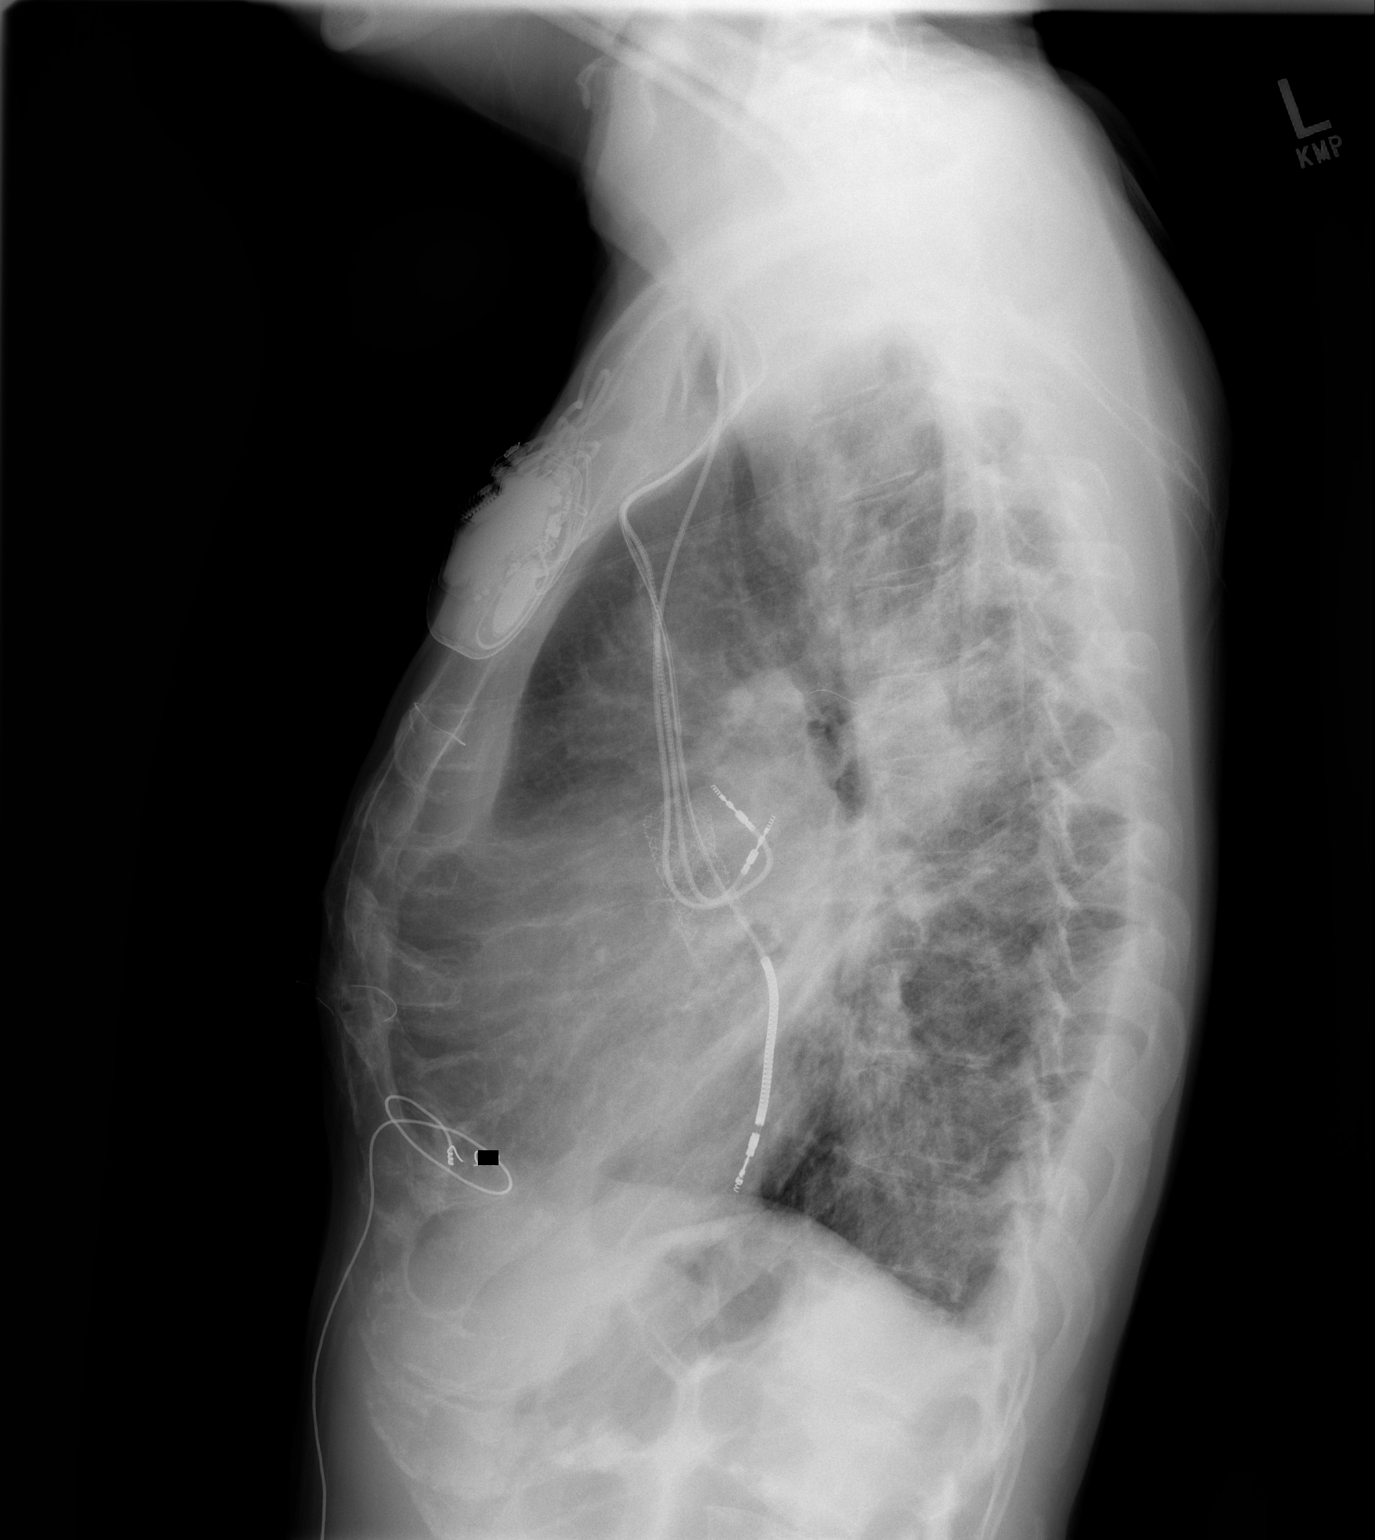

[2 of 2 positions shown; findings below may reference images not displayed]

FINDINGS: Small right pleural effusion. Bilateral interstitial thickening,
right greater than left. Patchy alveolar airspace opacities in the
right lung. No pneumothorax. Stable cardiomegaly. Three lead cardiac
pacemaker in unchanged position.

The osseous structures are unremarkable.
IMPRESSION: Findings most consistent with asymmetric pulmonary edema.
